# Patient Record
Sex: Female | Born: 1955
Health system: Southern US, Community
[De-identification: ages and names within clinical notes are randomized; demographics above are authoritative.]

## PROBLEM LIST (undated history)

## (undated) DIAGNOSIS — R569 Unspecified convulsions: Secondary | ICD-10-CM

## (undated) DIAGNOSIS — E119 Type 2 diabetes mellitus without complications: Secondary | ICD-10-CM

## (undated) DIAGNOSIS — M1990 Unspecified osteoarthritis, unspecified site: Secondary | ICD-10-CM

## (undated) DIAGNOSIS — J189 Pneumonia, unspecified organism: Secondary | ICD-10-CM

## (undated) DIAGNOSIS — C50912 Malignant neoplasm of unspecified site of left female breast: Principal | ICD-10-CM

## (undated) DIAGNOSIS — I639 Cerebral infarction, unspecified: Secondary | ICD-10-CM

## (undated) DIAGNOSIS — I10 Essential (primary) hypertension: Secondary | ICD-10-CM

## (undated) DIAGNOSIS — C801 Malignant (primary) neoplasm, unspecified: Secondary | ICD-10-CM

## (undated) HISTORY — DX: Unspecified convulsions: R56.9

## (undated) HISTORY — PX: FOOT SURGERY: SHX648

## (undated) HISTORY — PX: TUBAL LIGATION: SHX77

## (undated) HISTORY — DX: Malignant neoplasm of unspecified site of left female breast: C50.912

## (undated) HISTORY — PX: ABDOMINAL HYSTERECTOMY: SHX81

## (undated) HISTORY — DX: Type 2 diabetes mellitus without complications: E11.9

---

## 2006-08-20 ENCOUNTER — Ambulatory Visit: Payer: Self-pay | Admitting: Cardiology

## 2009-06-21 ENCOUNTER — Ambulatory Visit: Payer: Self-pay | Admitting: Physical Medicine & Rehabilitation

## 2009-06-21 ENCOUNTER — Inpatient Hospital Stay (HOSPITAL_COMMUNITY)
Admission: RE | Admit: 2009-06-21 | Discharge: 2009-07-10 | Payer: Self-pay | Admitting: Physical Medicine & Rehabilitation

## 2009-06-29 ENCOUNTER — Ambulatory Visit: Payer: Self-pay | Admitting: Physical Medicine & Rehabilitation

## 2009-09-21 ENCOUNTER — Encounter
Admission: RE | Admit: 2009-09-21 | Discharge: 2009-09-24 | Payer: Self-pay | Admitting: Physical Medicine & Rehabilitation

## 2009-09-24 ENCOUNTER — Ambulatory Visit: Payer: Self-pay | Admitting: Physical Medicine & Rehabilitation

## 2010-01-04 ENCOUNTER — Encounter
Admission: RE | Admit: 2010-01-04 | Discharge: 2010-04-04 | Payer: Self-pay | Source: Home / Self Care | Admitting: Physical Medicine & Rehabilitation

## 2010-01-11 ENCOUNTER — Ambulatory Visit: Payer: Self-pay | Admitting: Physical Medicine & Rehabilitation

## 2010-03-20 ENCOUNTER — Ambulatory Visit: Payer: Self-pay | Admitting: Physical Medicine & Rehabilitation

## 2010-07-15 ENCOUNTER — Encounter: Payer: PRIVATE HEALTH INSURANCE | Attending: Physical Medicine & Rehabilitation

## 2010-07-15 ENCOUNTER — Ambulatory Visit: Payer: PRIVATE HEALTH INSURANCE | Admitting: Physical Medicine & Rehabilitation

## 2010-07-15 DIAGNOSIS — G8110 Spastic hemiplegia affecting unspecified side: Secondary | ICD-10-CM

## 2010-07-15 DIAGNOSIS — I69959 Hemiplegia and hemiparesis following unspecified cerebrovascular disease affecting unspecified side: Secondary | ICD-10-CM | POA: Insufficient documentation

## 2010-07-15 DIAGNOSIS — I6330 Cerebral infarction due to thrombosis of unspecified cerebral artery: Secondary | ICD-10-CM

## 2010-07-15 DIAGNOSIS — I10 Essential (primary) hypertension: Secondary | ICD-10-CM

## 2010-07-25 LAB — COMPREHENSIVE METABOLIC PANEL
Albumin: 3.4 g/dL — ABNORMAL LOW (ref 3.5–5.2)
BUN: 16 mg/dL (ref 6–23)
Calcium: 9.1 mg/dL (ref 8.4–10.5)
Creatinine, Ser: 1.09 mg/dL (ref 0.4–1.2)
Total Protein: 7.3 g/dL (ref 6.0–8.3)

## 2010-07-25 LAB — BASIC METABOLIC PANEL
BUN: 16 mg/dL (ref 6–23)
BUN: 16 mg/dL (ref 6–23)
CO2: 28 mEq/L (ref 19–32)
Calcium: 9.3 mg/dL (ref 8.4–10.5)
Chloride: 103 mEq/L (ref 96–112)
Creatinine, Ser: 1.08 mg/dL (ref 0.4–1.2)
GFR calc non Af Amer: 53 mL/min — ABNORMAL LOW (ref >60)
Glucose, Bld: 96 mg/dL (ref 70–99)
Glucose, Bld: 98 mg/dL (ref 70–99)
Potassium: 3.6 mEq/L (ref 3.5–5.1)
Sodium: 136 mEq/L (ref 135–145)
Sodium: 136 mEq/L (ref 135–145)

## 2010-07-25 LAB — URINALYSIS, ROUTINE W REFLEX MICROSCOPIC
Glucose, UA: NEGATIVE mg/dL
Ketones, ur: NEGATIVE mg/dL
Nitrite: NEGATIVE
Specific Gravity, Urine: 1.024 (ref 1.005–1.030)
pH: 6.5 (ref 5.0–8.0)

## 2010-07-25 LAB — CBC
HCT: 39.3 % (ref 36.0–46.0)
MCHC: 35.7 g/dL (ref 30.0–36.0)
MCV: 93.3 fL (ref 78.0–100.0)
Platelets: 247 10*3/uL (ref 150–400)
RDW: 13 % (ref 11.5–15.5)

## 2010-07-25 LAB — DIFFERENTIAL
Lymphocytes Relative: 46 % (ref 12–46)
Monocytes Absolute: 0.5 10*3/uL (ref 0.1–1.0)
Monocytes Relative: 8 % (ref 3–12)
Neutro Abs: 2.4 10*3/uL (ref 1.7–7.7)
Neutrophils Relative %: 38 % — ABNORMAL LOW (ref 43–77)

## 2010-07-25 LAB — URINE CULTURE: Colony Count: 85000

## 2010-09-02 NOTE — Assessment & Plan Note (Signed)
Mrs. Fingerhut is back regarding her left pontine stroke.  We performed Botox in November to the right pectoralis major and minor as well as biceps brachii.  She had excellent results of this and has had increased range of motion.  Her pain is much better in the arm as well.  She is only having 3-4/10 pain levels.  Pain is dull.  More of an occasional twinge than anything else.  She is doing stretching at home.  She is working on walking still.  She is walking with and without her cane at times.  REVIEW OF SYSTEMS:  Notable for the above.  Does report occasional spasms, depression.  Full 12-point review is in the written health and history section of the chart.  SOCIAL HISTORY:  The patient is living with friend, no other changes are noted.  PHYSICAL EXAMINATION:  VITAL SIGNS:  Blood pressure is 149/81, pulse 51, respiratory rate 18, she is satting 98% on room air. GENERAL:  The patient is pleasant, alert and oriented x3.  Affect is generally bright and appropriate.  Right shoulder is improved.  She is able to actively abduct it to about 60 degrees.  Passively, she can go farther when I pushed her.  Biceps relaxes nicely and she has really near full elbow movement today.  Hands are bit tight and 1/4, but also relaxes and she can achieve neutral there.  Strength overall in the right upper extremity is grossly 2+ to 3+/5.  Right lower extremity is around 4/5.  She tends still to walk with the leg, externally rotated, and the right medial arch tends to collapse a bit in standing.  She has a bit of valgus moment there as well.  Range of motion in the right lower extremity is generally intact.  She may have been a bit tight still on the heel cord.  She continues to have a mild right central VII as well as tongue deviation.  Speech is clear.  Vision is intact. Cognitively, she is appropriate. HEART:  Regular. CHEST:  Clear. ABDOMEN:  Soft, nontender.  ASSESSMENT: 1. Left paramedian pontine  infarct with spastic right hemiparesis. 2. Hypertension.  PLAN: 1. Recommended her seeing an orthotist for medial insert into the shoe     or perhaps another type of shoe to support her foot further.  She     may do better with high-top shoe over the ankle to bend some of the     valgus.  I asked her to work on the quality of her gait, working on     better knee bend and straightening out the leg in swing phase.     Discussed some range of motion exercises also for her right     shoulder that she can do at home. 2. I will see her back in about 4 months' time.  I asked her to call     me if any problems or questions.     Ranelle Oyster, M.D. Electronically Signed    ZTS/MedQ D:  07/15/2010 13:34:32  T:  07/15/2010 22:09:19  Job #:  [id number]

## 2010-11-11 ENCOUNTER — Ambulatory Visit: Payer: PRIVATE HEALTH INSURANCE | Admitting: Physical Medicine & Rehabilitation

## 2010-12-13 ENCOUNTER — Encounter
Payer: PRIVATE HEALTH INSURANCE | Attending: Physical Medicine & Rehabilitation | Admitting: Physical Medicine & Rehabilitation

## 2010-12-13 DIAGNOSIS — I6330 Cerebral infarction due to thrombosis of unspecified cerebral artery: Secondary | ICD-10-CM

## 2010-12-13 DIAGNOSIS — I10 Essential (primary) hypertension: Secondary | ICD-10-CM | POA: Insufficient documentation

## 2010-12-13 DIAGNOSIS — I69959 Hemiplegia and hemiparesis following unspecified cerebrovascular disease affecting unspecified side: Secondary | ICD-10-CM | POA: Insufficient documentation

## 2010-12-13 DIAGNOSIS — G8110 Spastic hemiplegia affecting unspecified side: Secondary | ICD-10-CM

## 2010-12-13 NOTE — Assessment & Plan Note (Signed)
HISTORY:  Julie Parker is back regarding her left pontine stroke.  She has been generally doing fairly well since I last saw her.  She received Botox last fall and really had no significant problems with spasticity since.  She denies pain.  She is walking with her cane and independent at home.  She does feel that she stays favors her right leg still.  She is sleeping well.  She would like to return to full-time driving.  She has attempted driving already with a friend.  REVIEW OF SYSTEMS:  Notable for the above.  Full 12-point review is in the written health and history section of the chart.  SOCIAL HISTORY:  Unchanged.  She is still single.  PHYSICAL EXAMINATION:  VITAL SIGNS:  Blood pressure is 170/105, pulse 58, respiratory rate 16, and she is satting 99% on room air. GENERAL:  The patient is pleasant and alert.  She has active abduction now to really 90 degrees and beyond.  She does have some residual weakness at 3+4-/5 in the shoulder.  She has minimal to zero tone throughout the right upper and lower extremities today.  Strength really throughout the right upper extremity is grossly 4/5, right lower extremity is 4- to 4/5 throughout.  She tends to walk with the shuffle in the right leg and then right knee tends to hyperextend.  In standing, she has elevation of left hemipelvis over the right and actually measured her leg length and the right leg measured about 1.5 cm shorter than the left, going for may ASIS to medial malleolus.  She has mild central VII. HEART:  Regular. CHEST:  Clear. ABDOMEN:  Soft and nontender.  Sensory exam is grossly intact.  ASSESSMENT: 1. Left paramedian pontine infarct with spastic right hemiparesis. 2. Hypertension.  PLAN: 1. The patient could not afford the orthotist intervention is due to     noncoverage.  We did talk about today for leg length discrepancy,     was purchasing a foam quarter inch heel insert which may improve     her gait  quality. 2. I gave her permission to drive during the day. 3. The patient needs to follow up with her family physician regarding     blood pressure. 4. Recommended exercise at home as well as perhaps membership at the     [facility] to pursue pool activities such as water walking, water     aerobics, etc. 5. I will see her back as needed.  She has done quite well overall.     Ranelle Oyster, M.D. Electronically Signed    ZTS/MedQ D:  12/13/2010 13:10:54  T:  12/13/2010 15:13:56  Job #:  [id number]  cc:   Kirstie Peri, MD Fax: [phone]

## 2015-08-07 ENCOUNTER — Ambulatory Visit: Payer: Self-pay | Admitting: Surgery

## 2015-08-07 NOTE — H&P (Signed)
Julie Parker 08/07/2015 2:16 PM Location: [facility] Patient #: [id number] DOB: February 17, 1956 Single / Language: Julie Parker / Race: Black or African American Female  History of Present Illness [name] A. [name] MD; 08/07/2015 3:11 PM) Patient words: left breast cancer    Patient sent at the request of [name] for multifocal left breast cancer. The patient noticed 2 lumps in her left breast. She underwent mammography with core biopsy which found 2 areas of invasive ductal carcinoma. Each measured roughly a centimeter. Core biopsy showed invasive ductal carcinoma ER positive PR positive HER-2/neu negative. Ki-67 was 25 and 26%. She had an area of calcifications which were pleomorphic 4 cm anterior to the lateral lesion measuring 1 cm. Denies any history of breast pain or nipple discharge. She's had a previous stroke but is fairly functional using a wheelchair and walker. She continues to smoke cigarettes.  The patient is a 60 year old female.   Other Problems [name], CMA; 08/07/2015 2:17 PM) Back Pain Breast Cancer Cerebrovascular Accident High blood pressure Hypercholesterolemia Lump In Breast  Past Surgical History [name], [name]; 08/07/2015 2:17 PM) Breast Biopsy Left. Foot Surgery Right. Hysterectomy (not due to cancer) - Partial Resection of Stomach  Diagnostic Studies History [name], CMA; 08/07/2015 2:17 PM) Colonoscopy never Mammogram within last year Pap Smear >5 years ago  Allergies [name], CMA; 08/07/2015 2:17 PM) No Known Drug Allergies 08/07/2015  Medication History ([name], CMA; 08/07/2015 2:18 PM) Benazepril HCl (20MG Tablet, Oral) Active. Clopidogrel Bisulfate (75MG Tablet, Oral) Active. Atenolol-Chlorthalidone (50-25MG Tablet, Oral) Active. Aspirin (81MG Tablet Chewable, Oral) Active. Baclofen (10MG Tablet, Oral) Active. Medications Reconciled  Social History ([place]; 08/07/2015 2:17 PM) Alcohol  use Occasional alcohol use. Caffeine use Carbonated beverages, Coffee, Tea. No drug use Tobacco use Current every day smoker.  Family History [name], CMA; 08/07/2015 2:17 PM) Alcohol Abuse Family Members In General. Arthritis Brother, Mother, Sister. Breast Cancer Family Members In General. Cerebrovascular Accident Sister. Diabetes Mellitus Mother. Hypertension Mother, Sister.  Pregnancy / Birth History [name], [place]; 08/07/2015 2:17 PM) Age at menarche 69 years. Contraceptive History Oral contraceptives. Gravida 3 Irregular periods Maternal age 19-30 Para 1     Review of Systems ([place]; 08/07/2015 2:17 PM) General Present- Weight Gain and Weight Loss. Not Present- Appetite Loss, Chills, Fatigue, Fever and Night Sweats. Skin Not Present- Change in Wart/Mole, Dryness, Hives, Jaundice, New Lesions, Non-Healing Wounds, Rash and Ulcer. Breast Present- Breast Mass. Not Present- Breast Pain, Nipple Discharge and Skin Changes. Cardiovascular Not Present- Chest Pain, Difficulty Breathing Lying Down, Leg Cramps, Palpitations, Rapid Heart Rate, Shortness of Breath and Swelling of Extremities. Gastrointestinal Not Present- Abdominal Pain, Bloating, Bloody Stool, Change in Bowel Habits, Chronic diarrhea, Constipation, Difficulty Swallowing, Excessive gas, Gets full quickly at meals, Hemorrhoids, Indigestion, Nausea, Rectal Pain and Vomiting. Musculoskeletal Present- Joint Stiffness, Muscle Weakness and Swelling of Extremities. Not Present- Back Pain, Joint Pain and Muscle Pain. Neurological Present- Decreased Memory, Trouble walking and Weakness. Not Present- Fainting, Headaches, Numbness, Seizures, Tingling and Tremor. Psychiatric Not Present- Anxiety, Bipolar, Change in Sleep Pattern, Depression, Fearful and Frequent crying. Endocrine Not Present- Cold Intolerance, Excessive Hunger, Hair Changes, Heat Intolerance, Hot flashes and New Diabetes. Hematology Present-  Easy Bruising. Not Present- Excessive bleeding, Gland problems, HIV and Persistent Infections.  Vitals ([name] CMA; 08/07/2015 2:17 PM) 08/07/2015 2:17 PM Weight: 201.2 lb Height: 61in Body Surface Area: 1.89 m Body Mass Index: 38.02 kg/m  Temp.: 97.34F(Temporal)  Pulse: 79 (  Regular)  BP: 126/80 (Sitting, Left Arm, Standard)      Physical Exam (Maloni Musleh A. Jasmyn Picha MD; 08/07/2015 3:12 PM)  General Mental Status-Alert. General Appearance-Consistent with stated age. Hydration-Well hydrated. Voice-Normal.  Head and Neck Head-normocephalic, atraumatic with no lesions or palpable masses. Trachea-midline. Thyroid Gland Characteristics - normal size and consistency.  Breast Note: large breasted pendulous bilaterally  Left breast upper quadrant 1 cm mobile mass. Left breast upper inner quadrant 1 cm mobile mass. No nipple discharge.  Right breast without mass lesion nipple discharge or other abnormality.  Cardiovascular Cardiovascular examination reveals -normal heart sounds, regular rate and rhythm with no murmurs and normal pedal pulses bilaterally.  Neurologic Note: Right-sided hemiplegia  Lymphatic Head & Neck  General Head & Neck Lymphatics: Bilateral - Description - Normal. Axillary  General Axillary Region: Bilateral - Description - Normal. Tenderness - Non Tender.    Assessment & Plan (Jahnyla Parrillo A. Sarin Comunale MD; 08/07/2015 3:14 PM)  BREAST CANCER, LEFT (C50.912) Impression: Patient presents with 2 separate areas of invasive ductal carcinoma left breast ER positive PR positive HER-2/neu negative after mammography and all sham revealed 2 areas one in the left upper and the second the left inner quadrants. She has an area of calcifications which is 1.4 cm as well. She is a smoker who would not be the best reconstruction candidate. She does have a desire to preserve her breast and has very large breasts on exam. She may be a candidate for multiple  lumpectomy with sentinel lymph node mapping on the left which would be less surgery and therefore hopefully less complications for since she has some disability from her stroke.   Patient has desire to conserve breast therefore MRI will be obtained. She is ample tissue for multiple lumpectomies. She may not be a good breast reconstruction candidate due to her continued tobacco abuse which I've Counselor about. Refer to medical oncology, radiation collagen, plastic surgery and obtain MRI. If the MRI shows discrete entities that can be removed with lumpectomy, she is a strong interest in doing that and getting postoperative radiation therapy. If not, we'll proceed with left simple mastectomy and sentinel lymph node mapping. Risk of lumpectomy include bleeding, infection, seroma, more surgery, use of seed/wire, wound care, cosmetic deformity and the need for other treatments, death , blood clots, death. Pt agrees to proceed. Risk of sentinel lymph node mapping include bleeding, infection, lymphedema, shoulder pain. stiffness, dye allergy. cosmetic deformity , blood clots, death, need for more surgery. Pt agres to proceed. Discussed treatment options for breast cancer to include breast conservation vs mastectomy with reconstruction. Pt has decided on mastectomy. Risk include bleeding, infection, flap necrosis, pain, numbness, recurrence, hematoma, other surgery needs. Pt understands and agrees to proceed.  Current Plans Referred to Radiation Oncology, for evaluation and follow up (Radiation Oncology). Routine. Referred to Oncology, for evaluation and follow up (Oncology). Routine. Referred to Surgery - Plastic, for evaluation and follow up (Plastic Surgery). Routine. You are being scheduled for surgery - Our schedulers will call you.  You should hear from our office's scheduling department within 5 working days about the location, date, and time of surgery. We try to make accommodations for patient's  preferences in scheduling surgery, but sometimes the OR schedule or the surgeon's schedule prevents [place] from making those accommodations.  If you have not heard from our office [phone]) in 5 working days, call the office and ask for your surgeon's nurse.  If you have other questions about your diagnosis, plan, or  surgery, call the office and ask for your surgeon's nurse.  Pt Education - [facility] Breast Cancer Information Given - [facility] "Breast Journey" Package Pt Education - Pamphlet Given - Breast Biopsy: discussed with patient and provided information. We discussed the staging and pathophysiology of breast cancer. We discussed all of the different options for treatment for breast cancer including surgery, chemotherapy, radiation therapy, Herceptin, and antiestrogen therapy. We discussed a sentinel lymph node biopsy as she does not appear to having lymph node involvement right now. We discussed the performance of that with injection of radioactive tracer and blue dye. We discussed that she would have an incision underneath her axillary hairline. We discussed that there is a bout a 10-20% chance of having a positive node with a sentinel lymph node biopsy and we will await the permanent pathology to make any other first further decisions in terms of her treatment. One of these options might be to return to the operating room to perform an axillary lymph node dissection. We discussed about a 1-2% risk lifetime of chronic shoulder pain as well as lymphedema associated with a sentinel lymph node biopsy. We discussed the options for treatment of the breast cancer which included lumpectomy versus a mastectomy. We discussed the performance of the lumpectomy with a wire placement. We discussed a 10-20% chance of a positive margin requiring reexcision in the operating room. We also discussed that she may need radiation therapy or antiestrogen therapy or both if she undergoes lumpectomy. We discussed the mastectomy  and the postoperative care for that as well. We discussed that there is no difference in her survival whether she undergoes lumpectomy with radiation therapy or antiestrogen therapy versus a mastectomy. There is a slight difference in the local recurrence rate being 3-5% with lumpectomy and about 1% with a mastectomy. We discussed the risks of operation including bleeding, infection, possible reoperation. She understands her further therapy will be based on what her stages at the time of her operation.  Pt Education - flb breast cancer surgery: discussed with patient and provided information. Pt Education - CCS Mastectomy HCI Pt Education - [facility] Breast Biopsy HCI: discussed with patient and provided information. Pt Education - ABC (After Breast Cancer) Class Info: discussed with patient and provided information. Pt Education - CCS Free Text Education/Instructions: discussed with patient and provided information.

## 2015-08-09 ENCOUNTER — Other Ambulatory Visit: Payer: Self-pay | Admitting: Surgery

## 2015-08-09 DIAGNOSIS — C50912 Malignant neoplasm of unspecified site of left female breast: Secondary | ICD-10-CM

## 2015-08-17 ENCOUNTER — Ambulatory Visit
Admission: RE | Admit: 2015-08-17 | Discharge: 2015-08-17 | Disposition: A | Discharge status: Home / Self Care | Payer: Medicare HMO | Source: Ambulatory Visit | Attending: Surgery | Admitting: Surgery

## 2015-08-17 DIAGNOSIS — C50912 Malignant neoplasm of unspecified site of left female breast: Secondary | ICD-10-CM

## 2015-08-17 MED ORDER — GADOBENATE DIMEGLUMINE 529 MG/ML IV SOLN
20.0000 mL | Freq: Once | INTRAVENOUS | Status: AC | PRN
  Administered 2015-08-17: 20 mL via INTRAVENOUS

## 2015-08-29 ENCOUNTER — Other Ambulatory Visit: Payer: Self-pay | Admitting: Surgery

## 2015-08-29 DIAGNOSIS — N6310 Unspecified lump in the right breast, unspecified quadrant: Secondary | ICD-10-CM

## 2015-08-29 DIAGNOSIS — N6320 Unspecified lump in the left breast, unspecified quadrant: Secondary | ICD-10-CM

## 2015-09-03 ENCOUNTER — Other Ambulatory Visit: Payer: Self-pay | Admitting: Surgery

## 2015-09-03 DIAGNOSIS — N6310 Unspecified lump in the right breast, unspecified quadrant: Secondary | ICD-10-CM

## 2015-09-04 ENCOUNTER — Ambulatory Visit
Admission: RE | Admit: 2015-09-04 | Discharge: 2015-09-04 | Disposition: A | Discharge status: Home / Self Care | Payer: Medicare HMO | Source: Ambulatory Visit | Attending: Surgery | Admitting: Surgery

## 2015-09-04 ENCOUNTER — Other Ambulatory Visit: Payer: Medicare HMO

## 2015-09-04 ENCOUNTER — Ambulatory Visit: Admission: RE | Admit: 2015-09-04 | Payer: Medicare HMO | Source: Ambulatory Visit

## 2015-09-04 DIAGNOSIS — N6310 Unspecified lump in the right breast, unspecified quadrant: Secondary | ICD-10-CM

## 2015-09-13 ENCOUNTER — Other Ambulatory Visit: Payer: Medicare HMO

## 2015-09-21 ENCOUNTER — Ambulatory Visit: Payer: Self-pay | Admitting: Surgery

## 2015-09-21 DIAGNOSIS — C50812 Malignant neoplasm of overlapping sites of left female breast: Secondary | ICD-10-CM

## 2015-09-21 NOTE — H&P (Signed)
Julie Parker 09/21/2015 11:08 AM Location: [facility] Patient #: [id number] DOB: 26-Feb-1956 Single / Language: [name] / Race: Black or African American Female  History of Present Illness [name] A. Salaam Battershell MD; 09/21/2015 11:44 AM) Patient words: Patient returns for follow-up of her multifocal left breast cancer. Multiple areas in her left breast of been discovered for a total of 3 areas of abnormality with 2 of them being biopsied the third highly suspicious. She is seeing medical oncology and radiation oncology. Given her multiple medical problems, left simple mastectomy was recommended since she would not be a good candidate for postoperative radiation therapy and possibly not even chemotherapy. She returns today to discuss this. She has no complaints.  The patient is a 60 year old female.   Allergies ([name], CMA; 09/21/2015 11:08 AM) No Known Drug Allergies 08/07/2015  Medication History ([name], CMA; 09/21/2015 11:08 AM) Benazepril HCl (20MG Tablet, Oral) Active. Clopidogrel Bisulfate (75MG Tablet, Oral) Active. Atenolol-Chlorthalidone (50-25MG Tablet, Oral) Active. Aspirin (81MG Tablet Chewable, Oral) Active. Baclofen (10MG Tablet, Oral) Active. Medications Reconciled    Vitals ([name] CMA; 09/21/2015 11:08 AM) 09/21/2015 11:08 AM Height: 61in Temp.: 98.21F(Temporal)  Pulse: 76 (Regular)  BP: 130/80 (Sitting, Left Arm, Standard)      Physical Exam (Chandra Feger A. Falan Hensler MD; 09/21/2015 11:46 AM)  General Mental Status-Alert. General Appearance-Consistent with stated age. Hydration-Well hydrated. Voice-Normal.  Chest and Lung Exam Chest and lung exam reveals -quiet, even and easy respiratory effort with no use of accessory muscles and on auscultation, normal breath sounds, no adventitious sounds and normal vocal resonance. Inspection Chest Wall - Normal. Back - normal.  Breast Note: no change to her  exam  Cardiovascular Cardiovascular examination reveals -normal heart sounds, regular rate and rhythm with no murmurs and normal pedal pulses bilaterally.  Neurologic Note: wheelchair dependent weak on her left side  Musculoskeletal Note: in wheelchair    Assessment & Plan (Joelly Bolanos A. Jazir Newey MD; 09/21/2015 11:43 AM)  BREAST CANCER, LEFT (C50.912) Impression: Patient presents with 3 separate areas of invasive ductal carcinoma left breast ER positive PR positive HER-2/neu negative after mammography     Patient is seen medical oncology and given the multiple areas of cancer in her left breast and overall functional status, she would not do well with radiation therapy. We discussed this today and she is in agreement to undergo left simple mastectomy with sentinel lymph node mapping for multifocal left breast cancer. Discussed treatment options for breast cancer to include breast conservation vs mastectomy with reconstruction. Pt has decided on mastectomy. Risk include bleeding, infection, flap necrosis, pain, numbness, recurrence, hematoma, other surgery needs. Pt understands and agrees to proceed. Risk of sentinel lymph node mapping include bleeding, infection, lymphedema, shoulder pain. stiffness, dye allergy. cosmetic deformity , blood clots, death, need for more surgery. Pt agres to proceed.  Current Plans You are being scheduled for surgery - Our schedulers will call you.  You should hear from our office's scheduling department within 5 working days about the location, date, and time of surgery. We try to make accommodations for patient's preferences in scheduling surgery, but sometimes the OR schedule or the surgeon's schedule prevents [place] from making those accommodations.  If you have not heard from our office [phone]) in 5 working days, call the office and ask for your surgeon's nurse.  If you have other questions about your diagnosis, plan, or surgery, call the office and  ask for your surgeon's nurse.  We discussed the staging and  pathophysiology of breast cancer. We discussed all of the different options for treatment for breast cancer including surgery, chemotherapy, radiation therapy, Herceptin, and antiestrogen therapy. We discussed a sentinel lymph node biopsy as she does not appear to having lymph node involvement right now. We discussed the performance of that with injection of radioactive tracer and blue dye. We discussed that she would have an incision underneath her axillary hairline. We discussed that there is a bout a 10-20% chance of having a positive node with a sentinel lymph node biopsy and we will await the permanent pathology to make any other first further decisions in terms of her treatment. One of these options might be to return to the operating room to perform an axillary lymph node dissection. We discussed about a 1-2% risk lifetime of chronic shoulder pain as well as lymphedema associated with a sentinel lymph node biopsy. We discussed the options for treatment of the breast cancer which included lumpectomy versus a mastectomy. We discussed the performance of the lumpectomy with a wire placement. We discussed a 10-20% chance of a positive margin requiring reexcision in the operating room. We also discussed that she may need radiation therapy or antiestrogen therapy or both if she undergoes lumpectomy. We discussed the mastectomy and the postoperative care for that as well. We discussed that there is no difference in her survival whether she undergoes lumpectomy with radiation therapy or antiestrogen therapy versus a mastectomy. There is a slight difference in the local recurrence rate being 3-5% with lumpectomy and about 1% with a mastectomy. We discussed the risks of operation including bleeding, infection, possible reoperation. She understands her further therapy will be based on what her stages at the time of her operation.  Pt Education - flb  breast cancer surgery: discussed with patient and provided information. Pt Education - CCS Mastectomy HCI Pt Education - ABC (After Breast Cancer) Class Info: discussed with patient and provided information.

## 2015-10-23 ENCOUNTER — Inpatient Hospital Stay (HOSPITAL_COMMUNITY)
Admission: RE | Admit: 2015-10-23 | Discharge: 2015-10-23 | Disposition: A | Discharge status: Home / Self Care | Payer: Medicare HMO | Source: Ambulatory Visit

## 2015-10-29 ENCOUNTER — Encounter (HOSPITAL_COMMUNITY): Payer: Self-pay | Admitting: *Deleted

## 2015-10-29 ENCOUNTER — Ambulatory Visit (HOSPITAL_COMMUNITY): Payer: Medicare HMO | Admitting: Emergency Medicine

## 2015-10-29 MED ORDER — CEFAZOLIN SODIUM 10 G IJ SOLR
3.0000 g | INTRAMUSCULAR | Status: DC
Start: 2015-10-30 — End: 2015-10-30
  Filled 2015-10-29: qty 3000

## 2015-10-29 MED ORDER — DEXTROSE 5 % IV SOLN
3.0000 g | INTRAVENOUS | Status: DC
  Filled 2015-10-29: qty 3000

## 2015-10-29 NOTE — Progress Notes (Signed)
Pt denies cardiac history, chest pain or sob. She has had 2 strokes which have left her with right sided weakness. Pt's speech is slow but understandable.

## 2015-10-30 ENCOUNTER — Encounter (HOSPITAL_COMMUNITY): Payer: Self-pay | Admitting: Surgery

## 2015-10-30 ENCOUNTER — Encounter (HOSPITAL_COMMUNITY)
Admission: RE | Disposition: A | Discharge status: Home / Self Care | Payer: Self-pay | Source: Ambulatory Visit | Attending: Surgery

## 2015-10-30 ENCOUNTER — Ambulatory Visit (HOSPITAL_COMMUNITY)
Admission: RE | Admit: 2015-10-30 | Discharge: 2015-10-30 | Disposition: A | Discharge status: Home / Self Care | Payer: Medicare HMO | Source: Ambulatory Visit | Attending: Surgery | Admitting: Surgery

## 2015-10-30 ENCOUNTER — Other Ambulatory Visit: Payer: Self-pay

## 2015-10-30 ENCOUNTER — Ambulatory Visit (HOSPITAL_COMMUNITY): Payer: Medicare HMO

## 2015-10-30 DIAGNOSIS — C50812 Malignant neoplasm of overlapping sites of left female breast: Secondary | ICD-10-CM | POA: Insufficient documentation

## 2015-10-30 DIAGNOSIS — Z170 Estrogen receptor positive status [ER+]: Secondary | ICD-10-CM | POA: Diagnosis not present

## 2015-10-30 DIAGNOSIS — Z79899 Other long term (current) drug therapy: Secondary | ICD-10-CM | POA: Diagnosis not present

## 2015-10-30 DIAGNOSIS — Z539 Procedure and treatment not carried out, unspecified reason: Secondary | ICD-10-CM | POA: Insufficient documentation

## 2015-10-30 DIAGNOSIS — Z7982 Long term (current) use of aspirin: Secondary | ICD-10-CM | POA: Insufficient documentation

## 2015-10-30 DIAGNOSIS — Z993 Dependence on wheelchair: Secondary | ICD-10-CM | POA: Insufficient documentation

## 2015-10-30 HISTORY — DX: Unspecified osteoarthritis, unspecified site: M19.90

## 2015-10-30 HISTORY — DX: Malignant (primary) neoplasm, unspecified: C80.1

## 2015-10-30 HISTORY — DX: Pneumonia, unspecified organism: J18.9

## 2015-10-30 HISTORY — DX: Cerebral infarction, unspecified: I63.9

## 2015-10-30 HISTORY — DX: Essential (primary) hypertension: I10

## 2015-10-30 LAB — COMPREHENSIVE METABOLIC PANEL
ALBUMIN: 3.7 g/dL (ref 3.5–5.0)
ALK PHOS: 84 U/L (ref 38–126)
ALT: 27 U/L (ref 14–54)
AST: 26 U/L (ref 15–41)
Anion gap: 9 (ref 5–15)
BUN: 13 mg/dL (ref 6–20)
CALCIUM: 9.3 mg/dL (ref 8.9–10.3)
CHLORIDE: 105 mmol/L (ref 101–111)
CO2: 23 mmol/L (ref 22–32)
CREATININE: 0.9 mg/dL (ref 0.44–1.00)
GFR calc Af Amer: >60 mL/min (ref >60)
GFR calc non Af Amer: >60 mL/min (ref >60)
GLUCOSE: 116 mg/dL — AB (ref 65–99)
Potassium: 3.4 mmol/L — ABNORMAL LOW (ref 3.5–5.1)
SODIUM: 137 mmol/L (ref 135–145)
Total Bilirubin: 0.6 mg/dL (ref 0.3–1.2)
Total Protein: 6.9 g/dL (ref 6.5–8.1)

## 2015-10-30 LAB — CBC WITH DIFFERENTIAL/PLATELET
BASOS ABS: 0 10*3/uL (ref 0.0–0.1)
BASOS PCT: 0 %
EOS ABS: 0.2 10*3/uL (ref 0.0–0.7)
EOS PCT: 3 %
HCT: 40.6 % (ref 36.0–46.0)
HEMOGLOBIN: 14.8 g/dL (ref 12.0–15.0)
LYMPHS ABS: 3.3 10*3/uL (ref 0.7–4.0)
Lymphocytes Relative: 46 %
MCH: 31 pg (ref 26.0–34.0)
MCHC: 36.5 g/dL — ABNORMAL HIGH (ref 30.0–36.0)
MCV: 85.1 fL (ref 78.0–100.0)
Monocytes Absolute: 0.7 10*3/uL (ref 0.1–1.0)
Monocytes Relative: 9 %
NEUTROS PCT: 42 %
Neutro Abs: 3.1 10*3/uL (ref 1.7–7.7)
PLATELETS: 249 10*3/uL (ref 150–400)
RBC: 4.77 MIL/uL (ref 3.87–5.11)
RDW: 14.3 % (ref 11.5–15.5)
WBC: 7.3 10*3/uL (ref 4.0–10.5)

## 2015-10-30 SURGERY — SIMPLE MASTECTOMY WITH AXILLARY SENTINEL NODE BIOPSY
Anesthesia: General | Site: Breast | Laterality: Left

## 2015-10-30 MED ORDER — FENTANYL CITRATE (PF) 250 MCG/5ML IJ SOLN
INTRAMUSCULAR | Status: AC
  Filled 2015-10-30: qty 5

## 2015-10-30 MED ORDER — FENTANYL CITRATE (PF) 100 MCG/2ML IJ SOLN
INTRAMUSCULAR | Status: AC
  Filled 2015-10-30: qty 2

## 2015-10-30 MED ORDER — PROPOFOL 10 MG/ML IV BOLUS
INTRAVENOUS | Status: AC
  Filled 2015-10-30: qty 20

## 2015-10-30 MED ORDER — MIDAZOLAM HCL 2 MG/2ML IJ SOLN
INTRAMUSCULAR | Status: AC
  Filled 2015-10-30: qty 2

## 2015-10-30 MED ORDER — LACTATED RINGERS IV SOLN
INTRAVENOUS | Status: DC
  Administered 2015-10-30: 09:00:00 via INTRAVENOUS

## 2015-10-30 MED ORDER — CHLORHEXIDINE GLUCONATE 4 % EX LIQD: 1.0000 "application " | Freq: Once | CUTANEOUS | Status: DC

## 2015-10-30 NOTE — Progress Notes (Signed)
Patient assisted to wheelchair by nurses at bedside.   Patient escorted to main entrance via wheelchair by Network engineer and family.

## 2015-10-30 NOTE — H&P (Signed)
Pt having significant high blood issues and this is not controllable in the holding area  Multiple attempts to reduce this have not been fully successful and she will need to have better BP control by her primary care.  We will cancel and reschedule at a later time once she sees her primary care  Explained to the patient and she voiced her understanding

## 2015-10-30 NOTE — Interval H&P Note (Signed)
History and Physical Interval Note:  10/30/2015 10:55 AM  Julie Parker  has presented today for surgery, with the diagnosis of LEFT BREAST CANCER  The various methods of treatment have been discussed with the patient and family. After consideration of risks, benefits and other options for treatment, the patient has consented to  Procedure(s): LEFT SIMPLE MASTECTOMY WITH AXILLARY SENTINEL NODE BIOPSY (Left) as a surgical intervention .  The patient's history has been reviewed, patient examined, no change in status, stable for surgery.  I have reviewed the patient's chart and labs.  Questions were answered to the patient's satisfaction.     Malita Ignasiak A.

## 2015-10-30 NOTE — Progress Notes (Signed)
Dr.Moser at bedside, informed him of pts elevated bp.  Per Dr.Moser will continue to monitor.

## 2015-10-30 NOTE — H&P (Signed)
H&P   Julie Parker (MR# [id number])      H&P Info    Chief Strategy Officer Note Status Last Update User Last Update Date/Time   Erroll Luna, MD Signed Erroll Luna, MD     H&P    Expand All Collapse All   Gardiner Fanti  Location: [facility] Patient #: [id number] DOB: 08-13-1955 Single / Language: [name] / Race: Black or African American Female  History of Present Illness  Patient words: Patient returns for follow-up of her multifocal left breast cancer. Multiple areas in her left breast of been discovered for a total of 3 areas of abnormality with 2 of them being biopsied the third highly suspicious. She is seeing medical oncology and radiation oncology. Given her multiple medical problems, left simple mastectomy was recommended since she would not be a good candidate for postoperative radiation therapy and possibly not even chemotherapy. She returns today to discuss this. She has no complaints.  The patient is a 60 year old female.   Allergies  No Known Drug Allergies 08/07/2015  Medication History  Benazepril HCl (20MG Tablet, Oral) Active. Clopidogrel Bisulfate (75MG Tablet, Oral) Active. Atenolol-Chlorthalidone (50-25MG Tablet, Oral) Active. Aspirin (81MG Tablet Chewable, Oral) Active. Baclofen (10MG Tablet, Oral) Active. Medications Reconciled    Vitals  09/21/2015 11:08 AM Height: 61in Temp.: 98.29F(Temporal)  Pulse: 76 (Regular)  BP: 130/80 (Sitting, Left Arm, Standard)      Physical Exam   General Mental Status-Alert. General Appearance-Consistent with stated age. Hydration-Well hydrated. Voice-Normal.  Chest and Lung Exam Chest and lung exam reveals -quiet, even and easy respiratory effort with no use of accessory muscles and on auscultation, normal breath sounds, no adventitious sounds and normal vocal resonance. Inspection Chest Wall - Normal. Back - normal.  Breast Note: no change to her  exam  Cardiovascular Cardiovascular examination reveals -normal heart sounds, regular rate and rhythm with no murmurs and normal pedal pulses bilaterally.  Neurologic Note: wheelchair dependent weak on her left side  Musculoskeletal Note: in wheelchair    Assessment & Plan   BREAST CANCER, LEFT (C50.912) Impression: Patient presents with 3 separate areas of invasive ductal carcinoma left breast ER positive PR positive HER-2/neu negative after mammography     Patient is seen medical oncology and given the multiple areas of cancer in her left breast and overall functional status, she would not do well with radiation therapy. We discussed this today and she is in agreement to undergo left simple mastectomy with sentinel lymph node mapping for multifocal left breast cancer. Discussed treatment options for breast cancer to include breast conservation vs mastectomy with reconstruction. Pt has decided on mastectomy. Risk include bleeding, infection, flap necrosis, pain, numbness, recurrence, hematoma, other surgery needs. Pt understands and agrees to proceed. Risk of sentinel lymph node mapping include bleeding, infection, lymphedema, shoulder pain. stiffness, dye allergy. cosmetic deformity , blood clots, death, need for more surgery. Pt agres to proceed.  Current Plans You are being scheduled for surgery - Our schedulers will call you.  You should hear from our office's scheduling department within 5 working days about the location, date, and time of surgery. We try to make accommodations for patient's preferences in scheduling surgery, but sometimes the OR schedule or the surgeon's schedule prevents [place] from making those accommodations.  If you have not heard from our office [phone]) in 5 working days, call the office and ask for your surgeon's nurse.  If you have other questions about your diagnosis, plan, or surgery, call  the office and ask for your surgeon's nurse.  We  discussed the staging and pathophysiology of breast cancer. We discussed all of the different options for treatment for breast cancer including surgery, chemotherapy, radiation therapy, Herceptin, and antiestrogen therapy. We discussed a sentinel lymph node biopsy as she does not appear to having lymph node involvement right now. We discussed the performance of that with injection of radioactive tracer and blue dye. We discussed that she would have an incision underneath her axillary hairline. We discussed that there is a bout a 10-20% chance of having a positive node with a sentinel lymph node biopsy and we will await the permanent pathology to make any other first further decisions in terms of her treatment. One of these options might be to return to the operating room to perform an axillary lymph node dissection. We discussed about a 1-2% risk lifetime of chronic shoulder pain as well as lymphedema associated with a sentinel lymph node biopsy. We discussed the options for treatment of the breast cancer which included lumpectomy versus a mastectomy. We discussed the performance of the lumpectomy with a wire placement. We discussed a 10-20% chance of a positive margin requiring reexcision in the operating room. We also discussed that she may need radiation therapy or antiestrogen therapy or both if she undergoes lumpectomy. We discussed the mastectomy and the postoperative care for that as well. We discussed that there is no difference in her survival whether she undergoes lumpectomy with radiation therapy or antiestrogen therapy versus a mastectomy. There is a slight difference in the local recurrence rate being 3-5% with lumpectomy and about 1% with a mastectomy. We discussed the risks of operation including bleeding, infection, possible reoperation. She understands her further therapy will be based on what her stages at the time of her operation.  Pt Education - flb breast cancer surgery: discussed with  patient and provided information. Pt Education - CCS Mastectomy HCI Pt Education - ABC (After Breast Cancer) Class Info: discussed with patient and provided information.

## 2015-10-30 NOTE — Progress Notes (Addendum)
Patient's IV removed and clean, dry and intact upon removal.  Nurse tech and nurse assisted dressing patient.  Family not present at bedside.  Attempted to call family but no answer.

## 2015-12-05 ENCOUNTER — Ambulatory Visit: Payer: Self-pay | Admitting: Surgery

## 2015-12-05 DIAGNOSIS — C50912 Malignant neoplasm of unspecified site of left female breast: Secondary | ICD-10-CM

## 2015-12-06 ENCOUNTER — Encounter (HOSPITAL_COMMUNITY): Payer: Self-pay

## 2015-12-06 ENCOUNTER — Encounter (HOSPITAL_COMMUNITY)
Admission: RE | Admit: 2015-12-06 | Discharge: 2015-12-06 | Disposition: A | Discharge status: Home / Self Care | Payer: Medicare HMO | Source: Ambulatory Visit | Attending: Surgery | Admitting: Surgery

## 2015-12-06 DIAGNOSIS — Z01812 Encounter for preprocedural laboratory examination: Secondary | ICD-10-CM | POA: Insufficient documentation

## 2015-12-06 DIAGNOSIS — C50912 Malignant neoplasm of unspecified site of left female breast: Secondary | ICD-10-CM | POA: Insufficient documentation

## 2015-12-06 LAB — CBC
HCT: 43.1 % (ref 36.0–46.0)
Hemoglobin: 15.6 g/dL — ABNORMAL HIGH (ref 12.0–15.0)
MCH: 30.6 pg (ref 26.0–34.0)
MCHC: 36.2 g/dL — AB (ref 30.0–36.0)
MCV: 84.7 fL (ref 78.0–100.0)
Platelets: 257 10*3/uL (ref 150–400)
RBC: 5.09 MIL/uL (ref 3.87–5.11)
RDW: 13.7 % (ref 11.5–15.5)
WBC: 6.5 10*3/uL (ref 4.0–10.5)

## 2015-12-06 LAB — BASIC METABOLIC PANEL
Anion gap: 9 (ref 5–15)
BUN: 11 mg/dL (ref 6–20)
CALCIUM: 9.4 mg/dL (ref 8.9–10.3)
CO2: 22 mmol/L (ref 22–32)
CREATININE: 0.87 mg/dL (ref 0.44–1.00)
Chloride: 104 mmol/L (ref 101–111)
GFR calc non Af Amer: >60 mL/min (ref >60)
Glucose, Bld: 102 mg/dL — ABNORMAL HIGH (ref 65–99)
Potassium: 3.5 mmol/L (ref 3.5–5.1)
Sodium: 135 mmol/L (ref 135–145)

## 2015-12-06 NOTE — Pre-Procedure Instructions (Signed)
    Segundo  12/06/2015      [facility] [address] [place] [address] Phone: [phone] Fax: [phone]    Your procedure is scheduled on Thurs. Aug 10  Report to [facility] Admitting at 5:30 A.M.  Call this number if you have problems the morning of surgery:  [phone]   Remember:  Do not eat food or drink liquids after midnight on Wed. Aug 9  Take these medicines the morning of surgery with A SIP OF WATER: amlodipine(norvasc), atenolol(tenormin), baclofen if needed, ativan (lorazepam) if needed             Stop vitamins/herbal medicines and NSAIDS: ibuprofen, motrin, advil,aleve,BC Powders, Goody's powder today.            Stop plavix and aspirin per Dr. Brantley Stage. Stop on Aug. 4   Do not wear jewelry, make-up or nail polish.  Do not wear lotions, powders, or perfumes.  You may not wear deoderant.  Do not shave 48 hours prior to surgery.  Men may shave face and neck.  Do not bring valuables to the hospital.  [facility] is not responsible for any belongings or valuables.  Contacts, dentures or bridgework may not be worn into surgery.  Leave your suitcase in the car.  After surgery it may be brought to your room.  For patients admitted to the hospital, discharge time will be determined by your treatment team.  Patients discharged the day of surgery will not be allowed to drive home.    Special instructions:  Review preparing for surgery  Please read over the following fact sheets that you were given. Coughing and Deep Breathing

## 2015-12-06 NOTE — Progress Notes (Signed)
PCP: Dr. Francesco Runner @ [facility]. Requesting last office note and clearance if available.  Pt. Stated Dr. Brantley Stage instructed her to stop plavix  And aspirin on Aug. 4

## 2015-12-12 MED ORDER — CELECOXIB 200 MG PO CAPS
400.0000 mg | ORAL_CAPSULE | ORAL | Status: AC
  Administered 2015-12-13: 400 mg via ORAL
  Filled 2015-12-12: qty 2

## 2015-12-12 MED ORDER — ACETAMINOPHEN 500 MG PO TABS
1000.0000 mg | ORAL_TABLET | ORAL | Status: AC
  Administered 2015-12-13: 1000 mg via ORAL
  Filled 2015-12-12: qty 2

## 2015-12-12 MED ORDER — GABAPENTIN 300 MG PO CAPS
300.0000 mg | ORAL_CAPSULE | ORAL | Status: AC
  Administered 2015-12-13: 300 mg via ORAL
  Filled 2015-12-12: qty 1

## 2015-12-12 MED ORDER — CEFAZOLIN SODIUM-DEXTROSE 2-4 GM/100ML-% IV SOLN
2.0000 g | INTRAVENOUS | Status: AC
  Administered 2015-12-13: 2 g via INTRAVENOUS
  Filled 2015-12-12: qty 100

## 2015-12-12 NOTE — Anesthesia Preprocedure Evaluation (Addendum)
Anesthesia Evaluation  Patient identified by MRN, date of birth, ID band Patient awake    Reviewed: Allergy & Precautions, NPO status , Patient's Chart, lab work & pertinent test results, reviewed documented beta blocker date and time   History of Anesthesia Complications Negative for: history of anesthetic complications  Airway Mallampati: III  TM Distance: >3 FB Neck ROM: Full    Dental  (+) Dental Advisory Given, Teeth Intact   Pulmonary neg shortness of breath, COPD: no diagnosis, but suspected., neg recent URI, Current Smoker,  No COPD diagnosis, but I suspect she does have some component of COPD  Patient states this is her baseline breathing and that she always has wheezes. She does not feel short of breath. Will administer albuterol nebulizer preop.   + wheezing      Cardiovascular Exercise Tolerance: Poor hypertension, Pt. on medications and Pt. on home beta blockers (-) angina(-) Past MI, (-) Cardiac Stents and (-) CABG  Rhythm:Regular Rate:Normal     Neuro/Psych CVA (x2, right-side weakness, slow speech), Residual Symptoms    GI/Hepatic negative GI ROS, Neg liver ROS,   Endo/Other  negative endocrine ROS  Renal/GU negative Renal ROS     Musculoskeletal  (+) Arthritis ,   Abdominal (+) + obese,   Peds  Hematology  (+) Blood dyscrasia (on Plavix (last dose 8/4)), ,   Anesthesia Other Findings Left breast cancer  Patient was originally scheduled to have surgery in June but surgery was cancelled due to elevated BP in preop.  Reproductive/Obstetrics                          Anesthesia Physical Anesthesia Plan  ASA: III  Anesthesia Plan: General and Regional   Post-op Pain Management:  Regional for Post-op pain   Induction: Intravenous  Airway Management Planned: LMA  Additional Equipment:   Intra-op Plan:   Post-operative Plan: Extubation in OR  Informed Consent: I have  reviewed the patients History and Physical, chart, labs and discussed the procedure including the risks, benefits and alternatives for the proposed anesthesia with the patient or authorized representative who has indicated his/her understanding and acceptance.   Dental advisory given  Plan Discussed with:   Anesthesia Plan Comments: (Preop albuterol nebulizer)      Anesthesia Quick Evaluation

## 2015-12-13 ENCOUNTER — Ambulatory Visit (HOSPITAL_COMMUNITY): Payer: Medicare HMO | Admitting: Emergency Medicine

## 2015-12-13 ENCOUNTER — Encounter (HOSPITAL_COMMUNITY): Payer: Self-pay | Admitting: *Deleted

## 2015-12-13 ENCOUNTER — Encounter (HOSPITAL_COMMUNITY)
Admission: RE | Disposition: A | Discharge status: Home / Self Care | Payer: Self-pay | Source: Ambulatory Visit | Attending: Surgery

## 2015-12-13 ENCOUNTER — Ambulatory Visit (HOSPITAL_COMMUNITY)
Admission: RE | Admit: 2015-12-13 | Discharge: 2015-12-13 | Disposition: A | Discharge status: Home / Self Care | Payer: Medicare HMO | Source: Ambulatory Visit | Attending: Surgery | Admitting: Surgery

## 2015-12-13 ENCOUNTER — Observation Stay (HOSPITAL_COMMUNITY)
Admission: RE | Admit: 2015-12-13 | Discharge: 2015-12-14 | Disposition: A | Discharge status: Home / Self Care | Payer: Medicare HMO | Source: Ambulatory Visit | Attending: Surgery | Admitting: Surgery

## 2015-12-13 ENCOUNTER — Ambulatory Visit (HOSPITAL_COMMUNITY): Payer: Medicare HMO | Admitting: Certified Registered Nurse Anesthetist

## 2015-12-13 DIAGNOSIS — C50912 Malignant neoplasm of unspecified site of left female breast: Secondary | ICD-10-CM | POA: Diagnosis not present

## 2015-12-13 DIAGNOSIS — I10 Essential (primary) hypertension: Secondary | ICD-10-CM | POA: Diagnosis not present

## 2015-12-13 DIAGNOSIS — C50812 Malignant neoplasm of overlapping sites of left female breast: Secondary | ICD-10-CM

## 2015-12-13 DIAGNOSIS — M1990 Unspecified osteoarthritis, unspecified site: Secondary | ICD-10-CM | POA: Diagnosis not present

## 2015-12-13 DIAGNOSIS — C773 Secondary and unspecified malignant neoplasm of axilla and upper limb lymph nodes: Secondary | ICD-10-CM | POA: Insufficient documentation

## 2015-12-13 DIAGNOSIS — E669 Obesity, unspecified: Secondary | ICD-10-CM | POA: Diagnosis not present

## 2015-12-13 DIAGNOSIS — Z79899 Other long term (current) drug therapy: Secondary | ICD-10-CM | POA: Insufficient documentation

## 2015-12-13 DIAGNOSIS — Z7982 Long term (current) use of aspirin: Secondary | ICD-10-CM | POA: Diagnosis not present

## 2015-12-13 DIAGNOSIS — F17200 Nicotine dependence, unspecified, uncomplicated: Secondary | ICD-10-CM | POA: Diagnosis not present

## 2015-12-13 DIAGNOSIS — D0512 Intraductal carcinoma in situ of left breast: Secondary | ICD-10-CM | POA: Diagnosis not present

## 2015-12-13 DIAGNOSIS — Z170 Estrogen receptor positive status [ER+]: Secondary | ICD-10-CM | POA: Diagnosis not present

## 2015-12-13 DIAGNOSIS — D759 Disease of blood and blood-forming organs, unspecified: Secondary | ICD-10-CM | POA: Diagnosis not present

## 2015-12-13 DIAGNOSIS — Z6834 Body mass index (BMI) 34.0-34.9, adult: Secondary | ICD-10-CM | POA: Diagnosis not present

## 2015-12-13 DIAGNOSIS — Z7902 Long term (current) use of antithrombotics/antiplatelets: Secondary | ICD-10-CM | POA: Insufficient documentation

## 2015-12-13 DIAGNOSIS — Z8673 Personal history of transient ischemic attack (TIA), and cerebral infarction without residual deficits: Secondary | ICD-10-CM | POA: Diagnosis not present

## 2015-12-13 HISTORY — PX: SIMPLE MASTECTOMY WITH AXILLARY SENTINEL NODE BIOPSY: SHX6098

## 2015-12-13 HISTORY — DX: Malignant neoplasm of unspecified site of left female breast: C50.912

## 2015-12-13 LAB — GLUCOSE, CAPILLARY
GLUCOSE-CAPILLARY: 167 mg/dL — AB (ref 65–99)
GLUCOSE-CAPILLARY: 204 mg/dL — AB (ref 65–99)
Glucose-Capillary: 165 mg/dL — ABNORMAL HIGH (ref 65–99)

## 2015-12-13 LAB — CREATININE, SERUM: CREATININE: 0.97 mg/dL (ref 0.44–1.00)

## 2015-12-13 LAB — CBC
HEMATOCRIT: 42.7 % (ref 36.0–46.0)
HEMOGLOBIN: 15.5 g/dL — AB (ref 12.0–15.0)
MCH: 31.1 pg (ref 26.0–34.0)
MCHC: 36.3 g/dL — ABNORMAL HIGH (ref 30.0–36.0)
MCV: 85.6 fL (ref 78.0–100.0)
Platelets: 239 10*3/uL (ref 150–400)
RBC: 4.99 MIL/uL (ref 3.87–5.11)
RDW: 13.7 % (ref 11.5–15.5)
WBC: 7.7 10*3/uL (ref 4.0–10.5)

## 2015-12-13 SURGERY — SIMPLE MASTECTOMY WITH AXILLARY SENTINEL NODE BIOPSY
Anesthesia: Regional | Site: Breast | Laterality: Left

## 2015-12-13 MED ORDER — KCL IN DEXTROSE-NACL 20-5-0.9 MEQ/L-%-% IV SOLN
INTRAVENOUS | Status: DC
  Administered 2015-12-13: 13:00:00 via INTRAVENOUS
  Filled 2015-12-13 (×2): qty 1000

## 2015-12-13 MED ORDER — LIDOCAINE 2% (20 MG/ML) 5 ML SYRINGE
INTRAMUSCULAR | Status: AC
Start: 2015-12-13 — End: 2015-12-13
  Filled 2015-12-13: qty 5

## 2015-12-13 MED ORDER — PROMETHAZINE HCL 25 MG/ML IJ SOLN: 6.2500 mg | INTRAMUSCULAR | Status: DC | PRN

## 2015-12-13 MED ORDER — INSULIN ASPART 100 UNIT/ML ~~LOC~~ SOLN
0.0000 [IU] | Freq: Three times a day (TID) | SUBCUTANEOUS | Status: DC
  Administered 2015-12-13: 5 [IU] via SUBCUTANEOUS
  Administered 2015-12-13: 3 [IU] via SUBCUTANEOUS
  Administered 2015-12-14 (×2): 2 [IU] via SUBCUTANEOUS

## 2015-12-13 MED ORDER — PHENYLEPHRINE HCL 10 MG/ML IJ SOLN
INTRAVENOUS | Status: DC | PRN
  Administered 2015-12-13: 30 ug/min via INTRAVENOUS

## 2015-12-13 MED ORDER — SUCCINYLCHOLINE CHLORIDE 200 MG/10ML IV SOSY
PREFILLED_SYRINGE | INTRAVENOUS | Status: AC
  Filled 2015-12-13: qty 10

## 2015-12-13 MED ORDER — SIMETHICONE 80 MG PO CHEW: 40.0000 mg | CHEWABLE_TABLET | Freq: Four times a day (QID) | ORAL | Status: DC | PRN

## 2015-12-13 MED ORDER — ZOLPIDEM TARTRATE 5 MG PO TABS: 5.0000 mg | ORAL_TABLET | Freq: Every evening | ORAL | Status: DC | PRN

## 2015-12-13 MED ORDER — PROPOFOL 10 MG/ML IV BOLUS
INTRAVENOUS | Status: AC
  Filled 2015-12-13: qty 40

## 2015-12-13 MED ORDER — BENAZEPRIL HCL 20 MG PO TABS
20.0000 mg | ORAL_TABLET | Freq: Every day | ORAL | Status: DC
  Administered 2015-12-14: 20 mg via ORAL
  Filled 2015-12-13 (×2): qty 1

## 2015-12-13 MED ORDER — MIDAZOLAM HCL 2 MG/2ML IJ SOLN
INTRAMUSCULAR | Status: AC
  Filled 2015-12-13: qty 2

## 2015-12-13 MED ORDER — ACETAMINOPHEN 325 MG PO TABS
650.0000 mg | ORAL_TABLET | Freq: Four times a day (QID) | ORAL | Status: DC | PRN
Start: 2015-12-13 — End: 2015-12-14

## 2015-12-13 MED ORDER — AMLODIPINE BESYLATE 10 MG PO TABS
10.0000 mg | ORAL_TABLET | Freq: Every day | ORAL | Status: DC
  Administered 2015-12-14: 10 mg via ORAL
  Filled 2015-12-13 (×2): qty 1

## 2015-12-13 MED ORDER — CHLORTHALIDONE 25 MG PO TABS
25.0000 mg | ORAL_TABLET | Freq: Every day | ORAL | Status: DC
  Administered 2015-12-13 – 2015-12-14 (×2): 25 mg via ORAL
  Filled 2015-12-13 (×2): qty 1

## 2015-12-13 MED ORDER — LACTATED RINGERS IV SOLN
INTRAVENOUS | Status: DC | PRN
  Administered 2015-12-13 (×2): via INTRAVENOUS

## 2015-12-13 MED ORDER — DEXAMETHASONE SODIUM PHOSPHATE 10 MG/ML IJ SOLN
INTRAMUSCULAR | Status: DC | PRN
  Administered 2015-12-13: 8 mg via INTRAVENOUS

## 2015-12-13 MED ORDER — PHENYLEPHRINE HCL 10 MG/ML IJ SOLN
INTRAMUSCULAR | Status: DC | PRN
  Administered 2015-12-13 (×8): 40 ug via INTRAVENOUS
  Administered 2015-12-13: 80 ug via INTRAVENOUS

## 2015-12-13 MED ORDER — ACETAMINOPHEN 650 MG RE SUPP
650.0000 mg | Freq: Four times a day (QID) | RECTAL | Status: DC | PRN
Start: 2015-12-13 — End: 2015-12-14

## 2015-12-13 MED ORDER — PROPOFOL 10 MG/ML IV BOLUS
INTRAVENOUS | Status: AC
  Filled 2015-12-13: qty 20

## 2015-12-13 MED ORDER — PROPOFOL 10 MG/ML IV BOLUS
INTRAVENOUS | Status: DC | PRN
  Administered 2015-12-13: 50 mg via INTRAVENOUS
  Administered 2015-12-13: 150 mg via INTRAVENOUS

## 2015-12-13 MED ORDER — LIDOCAINE HCL (CARDIAC) 20 MG/ML IV SOLN
INTRAVENOUS | Status: DC | PRN
  Administered 2015-12-13: 100 mg via INTRAVENOUS

## 2015-12-13 MED ORDER — BUPIVACAINE HCL (PF) 0.25 % IJ SOLN
INTRAMUSCULAR | Status: DC | PRN
  Administered 2015-12-13: 30 mL via EPIDURAL

## 2015-12-13 MED ORDER — CLOPIDOGREL BISULFATE 75 MG PO TABS
75.0000 mg | ORAL_TABLET | Freq: Every day | ORAL | Status: DC
  Administered 2015-12-14: 75 mg via ORAL
  Filled 2015-12-13 (×2): qty 1

## 2015-12-13 MED ORDER — FENTANYL CITRATE (PF) 100 MCG/2ML IJ SOLN: 25.0000 ug | INTRAMUSCULAR | Status: DC | PRN

## 2015-12-13 MED ORDER — KETOROLAC TROMETHAMINE 15 MG/ML IJ SOLN: 15.0000 mg | Freq: Four times a day (QID) | INTRAMUSCULAR | Status: DC | PRN

## 2015-12-13 MED ORDER — CEFAZOLIN SODIUM-DEXTROSE 2-4 GM/100ML-% IV SOLN
2.0000 g | Freq: Three times a day (TID) | INTRAVENOUS | Status: AC
  Administered 2015-12-13: 2 g via INTRAVENOUS
  Filled 2015-12-13: qty 100

## 2015-12-13 MED ORDER — METHYLENE BLUE 0.5 % INJ SOLN
INTRAVENOUS | Status: AC
  Filled 2015-12-13: qty 10

## 2015-12-13 MED ORDER — BACLOFEN 10 MG PO TABS
10.0000 mg | ORAL_TABLET | Freq: Three times a day (TID) | ORAL | Status: DC | PRN
  Administered 2015-12-13: 10 mg via ORAL
  Filled 2015-12-13: qty 1

## 2015-12-13 MED ORDER — CHLORHEXIDINE GLUCONATE CLOTH 2 % EX PADS: 6.0000 | MEDICATED_PAD | Freq: Once | CUTANEOUS | Status: DC

## 2015-12-13 MED ORDER — POLYETHYLENE GLYCOL 3350 17 G PO PACK: 17.0000 g | PACK | Freq: Every day | ORAL | Status: DC | PRN

## 2015-12-13 MED ORDER — ONDANSETRON HCL 4 MG/2ML IJ SOLN: 4.0000 mg | Freq: Four times a day (QID) | INTRAMUSCULAR | Status: DC | PRN

## 2015-12-13 MED ORDER — ONDANSETRON 4 MG PO TBDP: 4.0000 mg | ORAL_TABLET | Freq: Four times a day (QID) | ORAL | Status: DC | PRN

## 2015-12-13 MED ORDER — ALBUTEROL SULFATE (2.5 MG/3ML) 0.083% IN NEBU: 2.5000 mg | INHALATION_SOLUTION | Freq: Once | RESPIRATORY_TRACT | Status: DC | PRN

## 2015-12-13 MED ORDER — FENTANYL CITRATE (PF) 250 MCG/5ML IJ SOLN
INTRAMUSCULAR | Status: AC
  Filled 2015-12-13: qty 5

## 2015-12-13 MED ORDER — FENTANYL CITRATE (PF) 250 MCG/5ML IJ SOLN
INTRAMUSCULAR | Status: DC | PRN
  Administered 2015-12-13: 50 ug via INTRAVENOUS
  Administered 2015-12-13 (×3): 25 ug via INTRAVENOUS
  Administered 2015-12-13 (×2): 50 ug via INTRAVENOUS
  Administered 2015-12-13: 25 ug via INTRAVENOUS

## 2015-12-13 MED ORDER — 0.9 % SODIUM CHLORIDE (POUR BTL) OPTIME
TOPICAL | Status: DC | PRN
  Administered 2015-12-13: 1000 mL

## 2015-12-13 MED ORDER — ONDANSETRON HCL 4 MG/2ML IJ SOLN
INTRAMUSCULAR | Status: DC | PRN
  Administered 2015-12-13: 4 mg via INTRAVENOUS

## 2015-12-13 MED ORDER — ALBUTEROL SULFATE (2.5 MG/3ML) 0.083% IN NEBU
INHALATION_SOLUTION | RESPIRATORY_TRACT | Status: AC
  Filled 2015-12-13: qty 3

## 2015-12-13 MED ORDER — HYDROMORPHONE HCL 1 MG/ML IJ SOLN: 1.0000 mg | INTRAMUSCULAR | Status: DC | PRN

## 2015-12-13 MED ORDER — TECHNETIUM TC 99M SULFUR COLLOID FILTERED
1.0000 | Freq: Once | INTRAVENOUS | Status: AC | PRN
  Administered 2015-12-13: 1 via INTRADERMAL

## 2015-12-13 MED ORDER — EPHEDRINE SULFATE 50 MG/ML IJ SOLN
INTRAMUSCULAR | Status: DC | PRN
  Administered 2015-12-13: 5 mg via INTRAVENOUS

## 2015-12-13 MED ORDER — ENOXAPARIN SODIUM 40 MG/0.4ML ~~LOC~~ SOLN
40.0000 mg | SUBCUTANEOUS | Status: DC
  Administered 2015-12-14: 40 mg via SUBCUTANEOUS
  Filled 2015-12-13: qty 0.4

## 2015-12-13 MED ORDER — ALBUTEROL SULFATE (2.5 MG/3ML) 0.083% IN NEBU
2.5000 mg | INHALATION_SOLUTION | Freq: Once | RESPIRATORY_TRACT | Status: AC
  Administered 2015-12-13: 2.5 mg via RESPIRATORY_TRACT

## 2015-12-13 MED ORDER — ATENOLOL 50 MG PO TABS
50.0000 mg | ORAL_TABLET | Freq: Every day | ORAL | Status: DC
  Administered 2015-12-14: 50 mg via ORAL
  Filled 2015-12-13 (×2): qty 1

## 2015-12-13 MED ORDER — SODIUM CHLORIDE 0.9 % IJ SOLN
INTRAMUSCULAR | Status: AC
  Filled 2015-12-13: qty 10

## 2015-12-13 MED ORDER — LORAZEPAM 0.5 MG PO TABS: 0.5000 mg | ORAL_TABLET | Freq: Every day | ORAL | Status: DC | PRN

## 2015-12-13 MED ORDER — OXYCODONE HCL 5 MG PO TABS
5.0000 mg | ORAL_TABLET | ORAL | Status: DC | PRN
  Administered 2015-12-13: 10 mg via ORAL
  Filled 2015-12-13: qty 2

## 2015-12-13 MED ORDER — MIDAZOLAM HCL 2 MG/2ML IJ SOLN
INTRAMUSCULAR | Status: DC | PRN
  Administered 2015-12-13 (×2): .5 mg via INTRAVENOUS

## 2015-12-13 MED ORDER — HYDRALAZINE HCL 20 MG/ML IJ SOLN: 10.0000 mg | INTRAMUSCULAR | Status: DC | PRN

## 2015-12-13 MED ORDER — ROCURONIUM BROMIDE 10 MG/ML (PF) SYRINGE
PREFILLED_SYRINGE | INTRAVENOUS | Status: AC
  Filled 2015-12-13: qty 10

## 2015-12-13 SURGICAL SUPPLY — 43 items
APPLIER CLIP 9.375 MED OPEN (MISCELLANEOUS) ×3
APR CLP MED 9.3 20 MLT OPN (MISCELLANEOUS) ×1
BINDER BREAST XXLRG (GAUZE/BANDAGES/DRESSINGS) ×2 IMPLANT
CANISTER SUCTION 2500CC (MISCELLANEOUS) ×3 IMPLANT
CHLORAPREP W/TINT 26ML (MISCELLANEOUS) ×3 IMPLANT
CLIP APPLIE 9.375 MED OPEN (MISCELLANEOUS) ×1 IMPLANT
CONT SPEC 4OZ CLIKSEAL STRL BL (MISCELLANEOUS) ×3 IMPLANT
COVER PROBE W GEL 5X96 (DRAPES) ×3 IMPLANT
COVER SURGICAL LIGHT HANDLE (MISCELLANEOUS) ×3 IMPLANT
DRAIN CHANNEL 19F RND (DRAIN) ×5 IMPLANT
DRAPE CHEST BREAST 15X10 FENES (DRAPES) ×3 IMPLANT
DRSG PAD ABDOMINAL 8X10 ST (GAUZE/BANDAGES/DRESSINGS) ×4 IMPLANT
ELECT CAUTERY BLADE 6.4 (BLADE) ×3 IMPLANT
ELECT REM PT RETURN 9FT ADLT (ELECTROSURGICAL) ×3
ELECTRODE REM PT RTRN 9FT ADLT (ELECTROSURGICAL) ×1 IMPLANT
EVACUATOR SILICONE 100CC (DRAIN) ×5 IMPLANT
GAUZE SPONGE 4X4 12PLY STRL (GAUZE/BANDAGES/DRESSINGS) ×2 IMPLANT
GLOVE BIO SURGEON STRL SZ 6 (GLOVE) ×6 IMPLANT
GLOVE BIO SURGEON STRL SZ7.5 (GLOVE) ×2 IMPLANT
GLOVE BIO SURGEON STRL SZ8 (GLOVE) ×3 IMPLANT
GLOVE BIOGEL PI IND STRL 6 (GLOVE) IMPLANT
GLOVE BIOGEL PI IND STRL 7.5 (GLOVE) IMPLANT
GLOVE BIOGEL PI IND STRL 8 (GLOVE) ×1 IMPLANT
GLOVE BIOGEL PI INDICATOR 6 (GLOVE) ×4
GLOVE BIOGEL PI INDICATOR 7.5 (GLOVE) ×2
GLOVE BIOGEL PI INDICATOR 8 (GLOVE) ×2
GOWN STRL REUS W/ TWL LRG LVL3 (GOWN DISPOSABLE) ×3 IMPLANT
GOWN STRL REUS W/ TWL XL LVL3 (GOWN DISPOSABLE) ×1 IMPLANT
GOWN STRL REUS W/TWL LRG LVL3 (GOWN DISPOSABLE) ×9
GOWN STRL REUS W/TWL XL LVL3 (GOWN DISPOSABLE) ×3
KIT BASIN OR (CUSTOM PROCEDURE TRAY) ×3 IMPLANT
KIT ROOM TURNOVER OR (KITS) ×3 IMPLANT
LIQUID BAND (GAUZE/BANDAGES/DRESSINGS) ×3 IMPLANT
NS IRRIG 1000ML POUR BTL (IV SOLUTION) ×3 IMPLANT
PACK GENERAL/GYN (CUSTOM PROCEDURE TRAY) ×3 IMPLANT
PAD ARMBOARD 7.5X6 YLW CONV (MISCELLANEOUS) ×3 IMPLANT
SPECIMEN JAR X LARGE (MISCELLANEOUS) ×3 IMPLANT
SUT ETHILON 3 0 FSL (SUTURE) ×5 IMPLANT
SUT MNCRL AB 4-0 PS2 18 (SUTURE) ×5 IMPLANT
SUT VIC AB 3-0 SH 18 (SUTURE) ×5 IMPLANT
SYR CONTROL 10ML LL (SYRINGE) IMPLANT
TAPE CLOTH SURG 6X10 WHT LF (GAUZE/BANDAGES/DRESSINGS) ×2 IMPLANT
TOWEL OR 17X26 10 PK STRL BLUE (TOWEL DISPOSABLE) ×3 IMPLANT

## 2015-12-13 NOTE — Care Management Obs Status (Signed)
MEDICARE OBSERVATION STATUS NOTIFICATION   Patient Details  Name: PAYZLEY KEYS MRN: [id number] Date of Birth: June 03, 1955   Medicare Observation Status Notification Given:       Pollie Friar, RN 12/13/2015, 11:55 AM

## 2015-12-13 NOTE — Anesthesia Procedure Notes (Signed)
Procedure Name: LMA Insertion Date/Time: 12/13/2015 8:47 AM Performed by: Merdis Delay Pre-anesthesia Checklist: Patient identified, Emergency Drugs available, Suction available, Timeout performed and Patient being monitored Patient Re-evaluated:Patient Re-evaluated prior to inductionOxygen Delivery Method: Circle system utilized Preoxygenation: Pre-oxygenation with 100% oxygen Intubation Type: IV induction LMA: LMA inserted LMA Size: 4.0 Number of attempts: 1 Placement Confirmation: positive ETCO2,  CO2 detector and breath sounds checked- equal and bilateral Tube secured with: Tape Dental Injury: Teeth and Oropharynx as per pre-operative assessment

## 2015-12-13 NOTE — H&P (Signed)
Pre-op/Pre-procedure Orders Open    09/21/2015 King, MD  General Surgery   Malignant neoplasm of overlapping sites of left female breast [facility])  Dx   H&P     Julie Parker  Location: [facility] Patient #: [id number] DOB: 09/22/1955 Single / Language: [name] / Race: Black or African American Female  History of Present Illness Julie Moores A. Bernie Fobes MD; /2017 11:44 AM) Patient words: Patient returns for follow-up of her multifocal left breast cancer. Multiple areas in her left breast of been discovered for a total of 3 areas of abnormality with 2 of them being biopsied the third highly suspicious. She is seeing medical oncology and radiation oncology. Given her multiple medical problems, left simple mastectomy was recommended since she would not be a good candidate for postoperative radiation therapy and possibly not even chemotherapy. She returns today to discuss this. She has no complaints.  The patient is a 60 year old female.   Allergies (Julie Parker, CMA;  No Known Drug Allergies 08/07/2015  Medication History (Julie Parker, CMA;  11:08 AM) Benazepril HCl (20MG Tablet, Oral) Active. Clopidogrel Bisulfate (75MG Tablet, Oral) Active. Atenolol-Chlorthalidone (50-25MG Tablet, Oral) Active. Aspirin (81MG Tablet Chewable, Oral) Active. Baclofen (10MG Tablet, Oral) Active. Medications Reconciled    Vitals (Julie Parker CMA;  09/21/2015 11:08 AM Height: 61in Temp.: 98.20F(Temporal)  Pulse: 76 (Regular)  BP: 130/80 (Sitting, Left Arm, Standard)      Physical Exam (Julie Kreis A. Wilena Tyndall MD;  General Mental Status-Alert. General Appearance-Consistent with stated age. Hydration-Well hydrated. Voice-Normal.  Chest and Lung Exam Chest and lung exam reveals -quiet, even and easy respiratory effort with no use of accessory muscles and on auscultation, normal breath sounds, no adventitious sounds and  normal vocal resonance. Inspection Chest Wall - Normal. Back - normal.  Breast Note: no change to her exam  Cardiovascular Cardiovascular examination reveals -normal heart sounds, regular rate and rhythm with no murmurs and normal pedal pulses bilaterally.  Neurologic Note: wheelchair dependent weak on her left side  Musculoskeletal Note: in wheelchair    Assessment & Plan (Julie Ohanian A. Avier Jech MD; )  BREAST CANCER, LEFT (C50.912) Impression: Patient presents with 3 separate areas of invasive ductal carcinoma left breast ER positive PR positive HER-2/neu negative after mammography     Patient is seen medical oncology and given the multiple areas of cancer in her left breast and overall functional status, she would not do well with radiation therapy. We discussed this today and she is in agreement to undergo left simple mastectomy with sentinel lymph node mapping for multifocal left breast cancer. Discussed treatment options for breast cancer to include breast conservation vs mastectomy with reconstruction. Pt has decided on mastectomy. Risk include bleeding, infection, flap necrosis, pain, numbness, recurrence, hematoma, other surgery needs. Pt understands and agrees to proceed. Risk of sentinel lymph node mapping include bleeding, infection, lymphedema, shoulder pain. stiffness, dye allergy. cosmetic deformity , blood clots, death, need for more surgery. Pt agres to proceed.  Current Plans You are being scheduled for surgery - Our schedulers will call you.  You should hear from our office's scheduling department within 5 working days about the location, date, and time of surgery. We try to make accommodations for patient's preferences in scheduling surgery, but sometimes the OR schedule or the surgeon's schedule prevents [place] from making those accommodations.  If you have not heard from our office [phone]) in 5 working days, call the office and ask for your  surgeon's nurse.  If you have other questions about your diagnosis, plan, or surgery, call the office and ask for your surgeon's nurse.  We discussed the staging and pathophysiology of breast cancer. We discussed all of the different options for treatment for breast cancer including surgery, chemotherapy, radiation therapy, Herceptin, and antiestrogen therapy. We discussed a sentinel lymph node biopsy as she does not appear to having lymph node involvement right now. We discussed the performance of that with injection of radioactive tracer and blue dye. We discussed that she would have an incision underneath her axillary hairline. We discussed that there is a bout a 10-20% chance of having a positive node with a sentinel lymph node biopsy and we will await the permanent pathology to make any other first further decisions in terms of her treatment. One of these options might be to return to the operating room to perform an axillary lymph node dissection. We discussed about a 1-2% risk lifetime of chronic shoulder pain as well as lymphedema associated with a sentinel lymph node biopsy. We discussed the options for treatment of the breast cancer which included lumpectomy versus a mastectomy. We discussed the performance of the lumpectomy with a wire placement. We discussed a 10-20% chance of a positive margin requiring reexcision in the operating room. We also discussed that she may need radiation therapy or antiestrogen therapy or both if she undergoes lumpectomy. We discussed the mastectomy and the postoperative care for that as well. We discussed that there is no difference in her survival whether she undergoes lumpectomy with radiation therapy or antiestrogen therapy versus a mastectomy. There is a slight difference in the local recurrence rate being 3-5% with lumpectomy and about 1% with a mastectomy. We discussed the risks of operation including bleeding, infection, possible reoperation. She understands  her further therapy will be based on what her stages at the time of her operation.  Pt Education - flb breast cancer surgery: discussed with patient and provided information. Pt Education - CCS Mastectomy HCI Pt Education - ABC (After Breast Cancer) Class Info: discussed with patient and provided information.    Additional Documentation   Encounter Info:   Billing Info,   History,   Allergies,   Detailed Report     Orders Placed    NM Sentinel Node Inj-No Rpt (Breast)  Medication Changes     None    Medication List  Visit Diagnoses      Malignant neoplasm of overlapping sites of left female breast [facility])    Problem List

## 2015-12-13 NOTE — Anesthesia Postprocedure Evaluation (Signed)
Anesthesia Post Note  Patient: Julie Parker  Procedure(s) Performed: Procedure(s) (LRB): LEFT SIMPLE MASTECTOMY WITH AXILLARY SENTINEL NODE BIOPSY (Left)  Patient location during evaluation: PACU Anesthesia Type: General Level of consciousness: awake and alert Pain management: pain level controlled Vital Signs Assessment: post-procedure vital signs reviewed and stable Respiratory status: spontaneous breathing, nonlabored ventilation, respiratory function stable and patient connected to nasal cannula oxygen Cardiovascular status: blood pressure returned to baseline and stable Postop Assessment: no signs of nausea or vomiting Anesthetic complications: no    Last Vitals:  Vitals:   12/13/15 0956 12/13/15 1020  BP:  (!) 152/80  Pulse: (!) 57 (!) 48  Resp: 14 12  Temp:  36.3 C    Last Pain:  Vitals:   12/13/15 1025  TempSrc:   PainSc: Asleep                 Nilda Simmer

## 2015-12-13 NOTE — Op Note (Signed)
Julie Parker Virnig 1956-02-19 [id number] 12/13/2015   Preoperative diagnosis: Multifocal left breast cancer  Postoperative diagnosis: Same  Procedure: Left simple mastectomy with left sentinel lymph node mapping  Surgeon: Kendi Defalco A.  Pensions consultant:   Anesthesia:General  Clinical History and Indications:The patient is seen in the holding area and we reviewed the plans for the procedure as noted above. We reviewed the risks and complications a final time. She had no further questions. I marked theleft side as the operative side.  Description of Procedure: The patient was taken to the operating room. After satisfactory general anesthesia was obtained the timeout was done.    A full prep and drape was then done.  I outlined an elliptical incision and marked the inframammary fold and midline. The incision was made. The usual skin flaps were raised. I went to the clavicle superiorly and sternum medially and towards the latissimus laterally.    After the superior flap was complete I was able to use the neoprobe to locate a sentinel node.  There was 1 hot left axillary sentinel node. This is removed. Background counts approached 0. There are no other abnormal nodes on exam.  Once the node was removed it was forwarded to pathology.   The inferior flap was then made going to the inframammary fold and out to the latissimus. The breast was then removed from the pectoralis starting medially and working laterally. When I got to the lateral aspect of the pectoralis major muscle I opened the clavipectoral fascia. I finished removing the breast from the serratus muscles.    I therefore completed the mastectomy by dividing the remaining attachments from the breast to the chest wall and latissimus but not taking any more tissue from the axilla. The specimen was marked to orient it for the pathologist and passed off the table.  I spent several minutes irrigating and making sure everything was  dry. I then placed two  19 [place] Blake drain through a small incision in the inferior flap and laid along the pectoralis muscle. It was secured with  2-0 nylon suture.  Another irrigation and check for hemostasis was made. Everything appeared to be dry. Incision was then closed with 3-0 Vicryl and 4-0 Monocryl. Liquid adhesive applied.  The patient tolerated the procedure well. There were no operative complications. All counts were correct. Estimated blood loss was 100 cc. Sterile dressings were applied and the patient taken to the PACU in satisfactory condition.  Turner Daniels, MD, FACS 12/13/2015 9:06 AM

## 2015-12-13 NOTE — Interval H&P Note (Signed)
History and Physical Interval Note:  12/13/2015 7:10 AM  Julie Parker  has presented today for surgery, with the diagnosis of LEFT BREAST CANCER  The various methods of treatment have been discussed with the patient and family. After consideration of risks, benefits and other options for treatment, the patient has consented to  Procedure(s): LEFT SIMPLE MASTECTOMY WITH AXILLARY SENTINEL NODE BIOPSY (Left) as a surgical intervention .  The patient's history has been reviewed, patient examined, no change in status, stable for surgery.  I have reviewed the patient's chart and labs.  Questions were answered to the patient's satisfaction.     Casy Tavano A.

## 2015-12-13 NOTE — Anesthesia Procedure Notes (Signed)
Anesthesia Regional Block:  Pectoralis block  Pre-Anesthetic Checklist: ,, timeout performed, Correct Patient, Correct Site, Correct Laterality, Correct Procedure, Correct Position, site marked, Risks and benefits discussed,  Surgical consent,  Pre-op evaluation,  At surgeon's request and post-op pain management  Laterality: Left  Prep: chloraprep       Needles:  Injection technique: Single-shot  Needle Type: Stimiplex     Needle Length: 9cm 9 cm Needle Gauge: 21 and 21 G    Additional Needles:  Procedures: ultrasound guided (picture in chart) Pectoralis block Narrative:  Injection made incrementally with aspirations every 5 mL.  Performed by: Personally  Anesthesiologist: Nilda Simmer

## 2015-12-13 NOTE — Transfer of Care (Signed)
Immediate Anesthesia Transfer of Care Note  Patient: Julie Parker  Procedure(s) Performed: Procedure(s): LEFT SIMPLE MASTECTOMY WITH AXILLARY SENTINEL NODE BIOPSY (Left)  Patient Location: PACU  Anesthesia Type:General  Level of Consciousness: patient cooperative, drowsy  Airway & Oxygen Therapy: Patient Spontanous Breathing and Patient connected to nasal cannula oxygen  Post-op Assessment: Report given to RN and Post -op Vital signs reviewed and stable  Post vital signs: Reviewed and stable  Last Vitals:  Vitals:   12/13/15 0643 12/13/15 0651  BP: (!) 185/91 (!) 168/94  Pulse: (!) 49   Resp: 16   Temp: 36.6 C     Last Pain:  Vitals:   12/13/15 0643  TempSrc: Oral      Patients Stated Pain Goal: 6 ([id number])  Complications: No apparent anesthesia complications

## 2015-12-14 ENCOUNTER — Encounter (HOSPITAL_COMMUNITY): Payer: Self-pay | Admitting: Surgery

## 2015-12-14 DIAGNOSIS — D0512 Intraductal carcinoma in situ of left breast: Secondary | ICD-10-CM | POA: Diagnosis not present

## 2015-12-14 LAB — GLUCOSE, CAPILLARY
GLUCOSE-CAPILLARY: 126 mg/dL — AB (ref 65–99)
Glucose-Capillary: 126 mg/dL — ABNORMAL HIGH (ref 65–99)

## 2015-12-14 MED ORDER — ONDANSETRON 4 MG PO TBDP: 4.0000 mg | ORAL_TABLET | Freq: Four times a day (QID) | ORAL | 0 refills | Status: AC | PRN

## 2015-12-14 MED ORDER — OXYCODONE HCL 5 MG PO TABS: 5.0000 mg | ORAL_TABLET | ORAL | 0 refills | Status: AC | PRN

## 2015-12-14 NOTE — Progress Notes (Signed)
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 She will need assistance at home to do this, pt states her boyfriend has had 2 CVAs himself, pt unable to tell nurse his deficits, pt states he will be able to help her at home. Asked pt to have him come to the hospital so we can teach him also how to care for drains, pt states her brother is the one coming to take her home, boyfriend not coming. Plan to educate brother on drains if willing and he and pt can show boyfriend how to care for and empty drains.  MD also ordered [facility] to come to pt's home, this info was related to unit case manager and will arrange. Pt would definitely benefit from [facility] to ensure pt and boyfriend managing JP drains correctly and assessing incisions for any problems--don't feel confident on pt's ability to manage alone, and cannot assess boyfriend's ability.

## 2015-12-14 NOTE — Discharge Instructions (Signed)
[facility] [place] °[phone] ° °MASTECTOMY: POST OP INSTRUCTIONS ° °Always review your discharge instruction sheet given to you by the facility where your surgery was performed. °IF YOU HAVE DISABILITY OR FAMILY LEAVE FORMS, YOU MUST BRING THEM TO THE OFFICE FOR PROCESSING.   °DO NOT GIVE THEM TO YOUR DOCTOR. °A prescription for pain medication may be given to you upon discharge.  Take your pain medication as prescribed, if needed.  If narcotic pain medicine is not needed, then you may take acetaminophen (Tylenol) or ibuprofen (Advil) as needed. °1. Take your usually prescribed medications unless otherwise directed. °2. If you need a refill on your pain medication, please contact your pharmacy.  They will contact our office to request authorization.  Prescriptions will not be filled after 5pm or on week-ends. °3. You should follow a light diet the first few days after arrival home, such as soup and crackers, etc.  Resume your normal diet the day after surgery. °4. Most patients will experience some swelling and bruising on the chest and underarm.  Ice packs will help.  Swelling and bruising can take several days to resolve.  °5. It is common to experience some constipation if taking pain medication after surgery.  Increasing fluid intake and taking a stool softener (such as Colace) will usually help or prevent this problem from occurring.  A mild laxative (Milk of Magnesia or Miralax) should be taken according to package instructions if there are no bowel movements after 48 hours. °6. Unless discharge instructions indicate otherwise, leave your bandage dry and in place until your next appointment in 3-5 days.  You may take a limited sponge bath.  No tube baths or showers until the drains are removed.  You may have steri-strips (small skin tapes) in place directly over the incision.  These strips should be left on the skin for 7-10 days.  If your surgeon used skin glue on the incision, you may  shower in 24 hours.  The glue will flake off over the next 2-3 weeks.  Any sutures or staples will be removed at the office during your follow-up visit. °7. DRAINS:  If you have drains in place, it is important to keep a list of the amount of drainage produced each day in your drains.  Before leaving the hospital, you should be instructed on drain care.  Call our office if you have any questions about your drains. °8. ACTIVITIES:  You may resume regular (light) daily activities beginning the next day--such as daily self-care, walking, climbing stairs--gradually increasing activities as tolerated.  You may have sexual intercourse when it is comfortable.  Refrain from any heavy lifting or straining until approved by your doctor. °a. You may drive when you are no longer taking prescription pain medication, you can comfortably wear a seatbelt, and you can safely maneuver your car and apply brakes. °b. RETURN TO WORK:  __________________________________________________________ °9. You should see your doctor in the office for a follow-up appointment approximately 3-5 days after your surgery.  Your doctor’s nurse will typically make your follow-up appointment when she calls you with your pathology report.  Expect your pathology report 2-3 business days after your surgery.  You may call to check if you do not hear from us after three days.   °10. OTHER INSTRUCTIONS: ______________________________________________________________________________________________ ____________________________________________________________________________________________ °WHEN TO CALL YOUR DOCTOR: °1. Fever over 101.0 °2. Nausea and/or vomiting °3. Extreme swelling or bruising °4. Continued bleeding from incision. °5. Increased pain, redness, or drainage from the incision. °  The clinic staff is available to answer your questions during regular business hours.  Please don’t hesitate to call and ask to speak to one of the nurses for clinical  concerns.  If you have a medical emergency, go to the nearest emergency room or call 911.  A surgeon from [facility] is always on call at the hospital. °[address], [address], [place], [place]  [address] ? [address] [address], [place], [place]   [address] °[phone] ? [phone] ? FAX [phone] °Web site: www.[facility] ° ° °.Bulb Drain Home Care °A bulb drain consists of a thin rubber tube and a soft, round bulb that creates a gentle suction. The rubber tube is placed in the area where you had surgery. A bulb is attached to the end of the tube that is outside the body. The bulb drain removes excess fluid that normally builds up in a surgical wound after surgery. The color and amount of fluid will vary. Immediately after surgery, the fluid is bright red and is a little thicker than water. It may gradually change to a yellow or pink color and become more thin and water-like. When the amount decreases to about 1 or 2 tbsp in 24 hours, your health care provider will usually remove it. °DAILY CARE °· Keep the bulb flat (compressed) at all times, except while emptying it. The flatness creates suction. You can flatten the bulb by squeezing it firmly in the middle and then closing the cap. °· Keep sites where the tube enters the skin dry and covered with a bandage (dressing). °· Secure the tube 1-2 in (2.5-5.1 cm) below the insertion sites to keep it from pulling on your stitches. The tube is stitched in place and will not slip out. °· Secure the bulb as directed by your health care provider. °· For the first 3 days after surgery, there usually is more fluid in the bulb. Empty the bulb whenever it becomes half full because the bulb does not create enough suction if it is too full. The bulb could also overflow. Write down how much fluid you remove each time you empty your drain. Add up the amount removed in 24 hours. °· Empty the bulb at the same time every day once the amount of fluid decreases and you  only need to empty it once a day. Write down the amounts and the 24-hour totals to give to your health care provider. This helps your health care provider know when the tubes can be removed. °EMPTYING THE BULB DRAIN °Before emptying the bulb, get a measuring cup, a piece of paper and a pen, and wash your hands. °· Gently run your fingers down the tube (stripping) to empty any drainage from the tubing into the bulb. This may need to be done several times a day to clear the tubing of clots and tissue. °· Open the bulb cap to release suction, which causes it to inflate. Do not touch the inside of the cap. °· Gently run your fingers down the tube (stripping) to empty any drainage from the tubing into the bulb. °· Hold the cap out of the way, and pour fluid into the measuring cup.   °· Squeeze the bulb to provide suction.  °· Replace the cap.   °· Check the tape that holds the tube to your skin. If it is becoming loose, you can remove the loose piece of tape and apply a new one. Then, pin the bulb to your shirt.   °· Write down   down the amount of fluid you emptied out. Write down the date and each time you emptied your bulb drain. (If there are 2 bulbs, note the amount of drainage from each bulb and keep the totals separate. Your health care provider will want to know the total amounts for each drain and which tube is draining more.)   Flush the fluid down the toilet and wash your hands.   Call your health care provider once you have less than 2 tbsp of fluid collecting in the bulb drain every 24 hours. If there is drainage around the tube site, change dressings and keep the area dry. Cleanse around tube with sterile saline and place dry gauze around site. This gauze should be changed when it is soiled. If it stays clean and unsoiled, it should still be changed daily.  SEEK MEDICAL CARE IF:  Your drainage has a bad smell or is cloudy.   You have a fever.   Your drainage is increasing instead of decreasing.    Your tube fell out.   You have redness or swelling around the tube site.   You have drainage from a surgical wound.   Your bulb drain will not stay flat after you empty it.  MAKE SURE YOU:   Understand these instructions.  Will watch your condition.  Will get help right away if you are not doing well or get worse.   This information is not intended to replace advice given to you by your health care provider. Make sure you discuss any questions you have with your health care provider.   Document Released: 04/18/2000 Document Revised: 05/12/2014 Document Reviewed: 11/08/2014 Elsevier Interactive Patient Education [employer].

## 2015-12-14 NOTE — Progress Notes (Signed)
1 Day Post-Op  Subjective: Pt doing well   Objective: Vital signs in last 24 hours: Temp:  [97.3 F (36.3 C)-98.1 F (36.7 C)] 98 F (36.7 C) (08/11 0510) Pulse Rate:  [44-57] 49 (08/11 0510) Resp:  [12-17] 15 (08/11 0510) BP: (119-152)/(69-82) 125/72 (08/11 0510) SpO2:  [96 %-100 %] 99 % (08/11 0510) FiO2 (%):  [2 %] 2 % (08/10 1400) Last BM Date:  (Not yet)  Intake/Output from previous day: 08/10 0701 - 08/11 0700 In: 1798.3 [I.V.:1698.3; IV Piggyback:100] Out: 1295 [Urine:1050; Drains:195; Blood:50] Intake/Output this shift: Total I/O In: -  Out: 300 [Urine:300]  Incision/Wound:CDI no hematoma   Lab Results:   Recent Labs  12/13/15 1251  WBC 7.7  HGB 15.5*  HCT 42.7  PLT 239   BMET  Recent Labs  12/13/15 1251  CREATININE 0.97   PT/INR No results for input(s): LABPROT, INR in the last 72 hours. ABG No results for input(s): PHART, HCO3 in the last 72 hours.  Invalid input(s): PCO2, PO2  Studies/Results: Nm Sentinel Node Inj-no Rpt (breast)  Result Date: 12/13/2015 CLINICAL DATA: left breast cancer Sulfur colloid was injected intradermally by the nuclear medicine technologist for breast cancer sentinel node localization.    Anti-infectives: Anti-infectives    Start     Dose/Rate Route Frequency Ordered Stop   12/13/15 1600  ceFAZolin (ANCEF) IVPB 2g/100 mL premix     2 g 200 mL/hr over 30 Minutes Intravenous Every 8 hours 12/13/15 1020 12/13/15 1630   12/13/15 0630  ceFAZolin (ANCEF) IVPB 2g/100 mL premix     2 g 200 mL/hr over 30 Minutes Intravenous To [facility] 12/12/15 1308 12/13/15 0757      Assessment/Plan: s/p Procedure(s): LEFT SIMPLE MASTECTOMY WITH AXILLARY SENTINEL NODE BIOPSY (Left) Discharge  WILL need HHN  For drain care   LOS: 1 day    Lakina Mcintire A. 12/14/2015

## 2015-12-14 NOTE — Discharge Summary (Signed)
Physician Discharge Summary  Patient ID: Julie Parker MRN: [id number] DOB/AGE: March 01, 1956 60 y.o.  Admit date: 12/13/2015 Discharge date: 12/14/2015  Admission Diagnoses: LEFT BREAST CANCER   Discharge Diagnoses:  Active Problems:   Breast cancer, left ([place])   Breast cancer ([place])   Discharged Condition: good  Hospital Course: PT DID WELL TOLERATED DIET WOUND WAS CLEAN GOOD CONTROL OF HER BLOOD PRESSURE   Consults: None    Treatments: surgery: LEFT SIMPLE MASTECTOMY   /SLN MAPPING   Discharge Exam: Blood pressure 125/72, pulse (!) 49, temperature 98 F (36.7 C), resp. rate 15, height 5\' 5"  (1.651 m), weight 93.4 kg (206 lb), SpO2 99 %. General appearance: alert and cooperative Resp: clear to auscultation bilaterally Cardio: regular rate and rhythm, S1, S2 normal, no murmur, click, rub or gallop Incision/Wound:CDI no hematoma  Flaps viable   Disposition: 01-Home or Self Care  Discharge Instructions    Diet - low sodium heart healthy    Complete by:  As directed   Increase activity slowly    Complete by:  As directed       Medication List    TAKE these medications   amLODipine 10 MG tablet Commonly known as:  NORVASC Take 1 tablet by mouth daily.   aspirin 81 MG EC tablet Take 1 tablet by mouth daily.   atenolol 50 MG tablet Commonly known as:  TENORMIN Take 1 tablet by mouth daily.   baclofen 10 MG tablet Commonly known as:  LIORESAL Take 1 tablet by mouth 3 (three) times daily as needed.   benazepril 20 MG tablet Commonly known as:  LOTENSIN Take 20 mg by mouth daily.   chlorthalidone 25 MG tablet Commonly known as:  HYGROTON Take 1 tablet by mouth daily.   clopidogrel 75 MG tablet Commonly known as:  PLAVIX Take 1 tablet by mouth daily.   LORazepam 0.5 MG tablet Commonly known as:  ATIVAN Take 1 tablet by mouth daily as needed for anxiety.   ondansetron 4 MG disintegrating tablet Commonly known as:  ZOFRAN-ODT Take 1 tablet (4 mg total) by  mouth every 6 (six) hours as needed for nausea.   oxyCODONE 5 MG immediate release tablet Commonly known as:  Oxy IR/ROXICODONE Take 1-2 tablets (5-10 mg total) by mouth every 4 (four) hours as needed for moderate pain.        Signed: Tamim Skog A. 12/14/2015, 8:51 AM

## 2015-12-14 NOTE — Care Management Note (Signed)
Case Management Note  Patient Details  Name: Julie Parker MRN: [id number] Date of Birth: [birth year]  Subjective/Objective:                    Action/Plan:   Expected Discharge Date:                  Expected Discharge Plan:  [place]  In-House Referral:     Discharge planning Services  CM Consult  Post Acute Care Choice:  Home Health Choice offered to:  Patient  DME Arranged:    DME Agency:     HH Arranged:  RN [place] Agency:  [place]  Status of Service:  Completed, signed off  If discussed at [place] of Stay Meetings, dates discussed:    Additional Comments:  Marilu Favre, RN 12/14/2015, 11:03 AM

## 2015-12-14 NOTE — Progress Notes (Signed)
Teaching of JP care done with pt and her brother. Brother able to demonstrate how to empty and recharge and how to strip the JP tubing as per d/c instructions given by MD in pt d/c info packet. Also pt able to measure the drainage amount with containers provided and set up paper for pt and brother to record output.  Pt's brother will instruct pt's boyfriend and HHRN to contact pt and come out to her home for continued instruction of JP drain with pt and boyfriend and assess drains and incision.    Copy of instructions and 1 script given to pt, other script sent into pt's pharmacy by MD, pt informed.  Pt d/c'd via wheelchair with belongings with brother, escorted by hospital volunteer.

## 2016-01-03 ENCOUNTER — Telehealth (HOSPITAL_COMMUNITY): Payer: Self-pay | Admitting: Emergency Medicine

## 2016-01-03 NOTE — Telephone Encounter (Signed)
Called to introduce myself to pt

## 2016-01-11 ENCOUNTER — Encounter (HOSPITAL_COMMUNITY): Payer: Self-pay

## 2016-01-15 ENCOUNTER — Encounter (HOSPITAL_COMMUNITY): Payer: Self-pay | Admitting: Hematology & Oncology

## 2016-01-15 ENCOUNTER — Encounter (HOSPITAL_COMMUNITY): Payer: Self-pay | Admitting: Lab

## 2016-01-15 ENCOUNTER — Encounter (HOSPITAL_COMMUNITY): Payer: Medicare HMO | Attending: Hematology & Oncology | Admitting: Hematology & Oncology

## 2016-01-15 VITALS — BP 149/80 | HR 56 | Temp 98.1°F | Resp 16 | Ht 65.0 in | Wt 201.5 lb

## 2016-01-15 DIAGNOSIS — Z8673 Personal history of transient ischemic attack (TIA), and cerebral infarction without residual deficits: Secondary | ICD-10-CM | POA: Diagnosis not present

## 2016-01-15 DIAGNOSIS — C50912 Malignant neoplasm of unspecified site of left female breast: Secondary | ICD-10-CM

## 2016-01-15 DIAGNOSIS — C50212 Malignant neoplasm of upper-inner quadrant of left female breast: Secondary | ICD-10-CM | POA: Diagnosis not present

## 2016-01-15 DIAGNOSIS — Z720 Tobacco use: Secondary | ICD-10-CM | POA: Diagnosis not present

## 2016-01-15 DIAGNOSIS — C50412 Malignant neoplasm of upper-outer quadrant of left female breast: Secondary | ICD-10-CM

## 2016-01-15 DIAGNOSIS — I6930 Unspecified sequelae of cerebral infarction: Secondary | ICD-10-CM

## 2016-01-15 NOTE — Progress Notes (Unsigned)
Referral sent to Dr Arnoldo Morale for port at 9/21 @3 .  Records faxed on 9/12

## 2016-01-15 NOTE — Progress Notes (Signed)
START OFF PATHWAY REGIMEN - Breast  Off Pathway: CMF (IV cyclophosphamide) q21 days  [id number]:CMF (IV cyclophosphamide) q21 days:   A cycle is every 21 days:     Cyclophosphamide (Cytoxan(R)) 600 mg/m2 in 250 mL NS IV over 60 minutes on day 1 only Dose Mod: None     Methotrexate 40 mg/m2 in 50 mL NS IV given over 15 minutes on day 1 only Dose Mod: None     5-Fluorouracil 600 mg/m2 in 50 mL NS IV given over 15 minutes on day 1 only Dose Mod: None Additional Orders: Risk of febrile neutropenia is considered to be < 10% (low). Goldhirsch et al.  Annals of Oncology [phone].  **Always confirm dose/schedule in your pharmacy ordering system**    Patient Characteristics: Adjuvant Therapy, Node Positive (1-3), HER2/neu Negative/Unknown/Equivocal, ER Positive, MammaPrint(R) Ordered, High Genomic Risk AJCC Stage Grouping: IIB Current Disease Status: No Distant Mets or Local Recurrence AJCC M Stage: 0 ER Status: Positive (+) AJCC N Stage: 1 AJCC T Stage: 2 HER2/neu: Negative (-) PR Status: Positive (+) Node Status: Positive (+) (1-3 Nodes) Has this patient completed genomic testing? Yes - MammaPrint(R) MammaPrint(R) Score: High Genomic Risk  Intent of Therapy: Curative Intent, Discussed with Patient

## 2016-01-15 NOTE — Progress Notes (Signed)
Met with pt face to face.  Introduced myself and explain a little bit about my role as the patient navigator.  Pt given a card with all my information on it.  Told pt to call if they had any questions or concerns.  Pt verbalized understanding.

## 2016-01-15 NOTE — Patient Instructions (Signed)
[facility] at [facility] Discharge Instructions  RECOMMENDATIONS MADE BY THE CONSULTANT AND ANY TEST RESULTS WILL BE SENT TO YOUR REFERRING PHYSICIAN.  You saw Dr. Penland today.  Thank you for choosing [facility] at [facility] to provide your oncology and hematology care.  To afford each patient quality time with our provider, please arrive at least 15 minutes before your scheduled appointment time.   Beginning January 23rd 2017 lab work for the Cancer Center will be done in the  Main lab at [place] on 1st floor. If you have a lab appointment with the Cancer Center please come in thru the  Main Entrance and check in at the main information desk  You need to re-schedule your appointment should you arrive 10 or more minutes late.  We strive to give you quality time with our providers, and arriving late affects you and other patients whose appointments are after yours.  Also, if you no show three or more times for appointments you may be dismissed from the clinic at the providers discretion.     Again, thank you for choosing [facility].  Our hope is that these requests will decrease the amount of time that you wait before being seen by our physicians.       _____________________________________________________________  Should you have questions after your visit to [facility], please contact our office at [phone] between the hours of 8:30 a.m. and 4:30 p.m.  Voicemails left after 4:30 p.m. will not be returned until the following business day.  For prescription refill requests, have your pharmacy contact our office.         Resources For Cancer Patients and their Caregivers ? American Cancer Society: Can assist with transportation, wigs, general needs, runs Look Good Feel Better.        1-888-227-6333 ? Cancer Care: Provides financial assistance, online support groups, medication/co-pay assistance.   1-800-813-HOPE (4673) ? [facility] Assists [place] cancer patients and their families through emotional , educational and financial support.  336-427-4357 ? [place] DSS Where to apply for food stamps, Medicaid and utility assistance. 336-342-1394 ? [facility]: Transportation to medical appointments. 336-347-2287 ? Social Security Administration: May apply for disability if have a Stage IV cancer. 336-342-7796 1-800-772-1213 ? [place] Aging, Disability and Transit Services: Assists with nutrition, care and transit needs. 336-349-2343  Cancer Center Support Programs: @10RELATIVEDAYS@ > Cancer Support Group  2nd Tuesday of the month 1pm-2pm, Journey Room  > Creative Journey  3rd Tuesday of the month 1130am-1pm, Journey Room  > Look Good Feel Better  1st Wednesday of the month 10am-12 noon, Journey Room (Call American Cancer Society to register 1-800-395-5775)    

## 2016-01-15 NOTE — Progress Notes (Signed)
[facility]  CONSULT NOTE  Patient Care Team: Pcp Not In System as PCP - General  CHIEF COMPLAINTS/PURPOSE OF CONSULTATION:  pT2pN1 multifocal carcinoma of the L breast, ER+ PR+ HER 2 -  Mastectomy HX CVA Tobacco Use High risk Mammprint  HISTORY OF PRESENTING ILLNESS:  Julie Parker 60 y.o. female is here for consultation of recently diagnosed multifocal carcinoma of the L breast. Disease is ER+ PR+ (100%, 5%) and HER 2 -.She has undergone a mastectomy with Dr. Brantley Stage. She had previously seen Dr. Tressie Stalker in consultation. Mammaprint has been sent and noted to be high risk. She presents for additional discussion of therapy. Julie Parker notes that she would like to try any therapy that may reduced the changes of her "cancer coming back."  Patient currently lives with her boyfriend. She has had two strokes before (one in 2011 and one in 2012) and now has difficulty walking. She walks with a cane. She does not work and cannot drive due to her disability. Thus, it is difficult for her to get around. She notes that she depends upon [employer] or others to help her with transportation. Travel to [place] is difficult for her.   Patient felt multiple lumps in her breast. She told [name] who said she needed a mammogram. She had not had a preceeding mammogram since 2008. After she had the mammogram, a biopsy was conducted and a surgery was preformed. Julie Parker says she was never told that she needed chemotherapy or radiation. She notes that she does want treatment that may "help her."  Julie Parker says her boyfriend can help support her if she does need chemotherapy.  Julie Parker's appetite is normal. She does not sleep well at night because she sleeps a lot during the day. She is up a lot at night.  She currently does not have a cardiologist or a neurologist.  She notes that she did well with her surgery. She denies any pain.     MEDICAL HISTORY:  Past Medical History:  Diagnosis Date  . Arthritis   .  Cancer [facility])    left breast cancer  . Hypertension   . Pneumonia   . Stroke [facility])    2 strokes, right side weakness    SURGICAL HISTORY: Past Surgical History:  Procedure Laterality Date  . ABDOMINAL HYSTERECTOMY    . FOOT SURGERY Right   . SIMPLE MASTECTOMY WITH AXILLARY SENTINEL NODE BIOPSY Left 12/13/2015   Procedure: LEFT SIMPLE MASTECTOMY WITH AXILLARY SENTINEL NODE BIOPSY;  Surgeon: Erroll Luna, MD;  Location: [facility];  Service: General;  Laterality: Left;  . TUBAL LIGATION      SOCIAL HISTORY: Social History   Social History  . Marital status: Widowed    Spouse name: N/A  . Number of children: N/A  . Years of education: N/A   Occupational History  . Not on file.   Social History Main Topics  . Smoking status: Current Every Day Smoker    Packs/day: 0.25    Years: 20.00    Types: Cigarettes  . Smokeless tobacco: Never Used  . Alcohol use No  . Drug use: No  . Sexual activity: Not on file   Other Topics Concern  . Not on file   Social History Narrative  . No narrative on file   She currently smokes but is trying to quit. She started smoking when she was about 21. She smokes 4-5 packs a day.  She does not drink alcohol.  She enjoys  playing sports, playing spades, watching tv. She does not work because she is disabled. She has four brother and three sisters.   FAMILY HISTORY: Family History  Problem Relation Age of Onset  . Hypertension Mother   . Diabetes Mother   . Other Father     sleeping disease   Dad passed away in his sleep around age 24 years old.  Mom is still living. She is about 69 years old. Mother (Julie Parker)   ALLERGIES:  is allergic to no known allergies.  MEDICATIONS:  Current Outpatient Prescriptions  Medication Sig Dispense Refill  . amLODipine (NORVASC) 10 MG tablet Take 1 tablet by mouth daily.    Marland Kitchen aspirin 81 MG EC tablet Take 1 tablet by mouth daily.    Marland Kitchen atenolol (TENORMIN) 50 MG tablet Take 1 tablet by mouth daily.     . baclofen (LIORESAL) 10 MG tablet Take 1 tablet by mouth 3 (three) times daily as needed.    . benazepril (LOTENSIN) 20 MG tablet Take 20 mg by mouth daily.     . chlorthalidone (HYGROTON) 25 MG tablet Take 1 tablet by mouth daily.    . clopidogrel (PLAVIX) 75 MG tablet Take 1 tablet by mouth daily.    Marland Kitchen LORazepam (ATIVAN) 0.5 MG tablet Take 1 tablet by mouth daily as needed for anxiety.     . ondansetron (ZOFRAN-ODT) 4 MG disintegrating tablet Take 1 tablet (4 mg total) by mouth every 6 (six) hours as needed for nausea. 20 tablet 0  . oxyCODONE (OXY IR/ROXICODONE) 5 MG immediate release tablet Take 1-2 tablets (5-10 mg total) by mouth every 4 (four) hours as needed for moderate pain. 30 tablet 0   No current facility-administered medications for this visit.     Review of Systems  Constitutional: Negative.   HENT: Negative.   Eyes: Negative.   Respiratory: Negative.   Cardiovascular: Negative.   Gastrointestinal: Negative.   Genitourinary: Negative.   Musculoskeletal: Negative.   Skin: Negative.   Neurological: Positive for sensory change, speech change and focal weakness.  Endo/Heme/Allergies: Negative.   Psychiatric/Behavioral: Positive for memory loss. The patient has insomnia.   All other systems reviewed and are negative. 14 point ROS was done and is otherwise as detailed above or in HPI   PHYSICAL EXAMINATION: ECOG PERFORMANCE STATUS: 1 - Symptomatic but completely ambulatory  Vitals:   01/15/16 1545  BP: (!) 149/80  Pulse: (!) 56  Resp: 16  Temp: 98.1 F (36.7 C)   Filed Weights   01/15/16 1545  Weight: 201 lb 8 oz (91.4 kg)     Physical Exam  Constitutional: She is oriented to person, place, and time and well-developed, well-nourished, and in no distress.  HENT:  Head: Normocephalic and atraumatic.  Nose: Nose normal.  Mouth/Throat: Oropharynx is clear and moist. No oropharyngeal exudate.  Eyes: Conjunctivae and EOM are normal. Pupils are equal, round,  and reactive to light. Right eye exhibits no discharge. Left eye exhibits no discharge. No scleral icterus.  Neck: Normal range of motion. Neck supple. No tracheal deviation present. No thyromegaly present.  Cardiovascular: Normal rate, regular rhythm and normal heart sounds.  Exam reveals no gallop and no friction rub.   No murmur heard. Pulmonary/Chest: Effort normal and breath sounds normal. She has no wheezes. She has no rales.    Abdominal: Soft. Bowel sounds are normal. She exhibits no distension and no mass. There is no tenderness. There is no rebound and no guarding.  Musculoskeletal: Normal range  of motion. She exhibits no edema.  Lymphadenopathy:    She has no cervical adenopathy.  Neurological: She is alert and oriented to person, place, and time. No cranial nerve deficit. She exhibits abnormal muscle tone. Coordination abnormal.  Assistance onto exam table, weakness of both RUE and RLE  Skin: Skin is warm and dry. No rash noted.  Psychiatric: Mood, memory, affect and judgment normal.  Nursing note and vitals reviewed.     LABORATORY DATA:  I have reviewed the data as listed Lab Results  Component Value Date   WBC 7.7 12/13/2015   HGB 15.5 (H) 12/13/2015   HCT 42.7 12/13/2015   MCV 85.6 12/13/2015   PLT 239 12/13/2015   CMP     Component Value Date/Time   NA 135 12/06/2015 1620   K 3.5 12/06/2015 1620   CL 104 12/06/2015 1620   CO2 22 12/06/2015 1620   GLUCOSE 102 (H) 12/06/2015 1620   BUN 11 12/06/2015 1620   CREATININE 0.97 12/13/2015 1251   CALCIUM 9.4 12/06/2015 1620   PROT 6.9 10/30/2015 0848   ALBUMIN 3.7 10/30/2015 0848   AST 26 10/30/2015 0848   ALT 27 10/30/2015 0848   ALKPHOS 84 10/30/2015 0848   BILITOT 0.6 10/30/2015 0848   GFRNONAA >60 12/13/2015 1251   GFRAA >60 12/13/2015 1251     RADIOGRAPHIC STUDIES: I have personally reviewed the radiological images as listed and agreed with the findings in the report. No results found.  Study Result    CLINICAL DATA:  Recently diagnosed invasive ductal carcinoma in the 10 o'clock and 2 o'clock positions of the left breast. The patient also has a 1.5 x 1.1 x 0.4 cm group of microcalcifications suspicious for ductal carcinoma in situ in the upper-outer quadrant of the left breast. These have not been biopsied.  LABS:  Creatinine was obtained on site at [facility] at [address]Results: Creatinine 0.8 mg/dL.  EXAM: BILATERAL BREAST MRI WITH AND WITHOUT CONTRAST  TECHNIQUE: Multiplanar, multisequence MR images of both breasts were obtained prior to and following the intravenous administration of 20 ml of MultiHance.  THREE-DIMENSIONAL MR IMAGE RENDERING ON INDEPENDENT WORKSTATION:  Three-dimensional MR images were rendered by post-processing of the original MR data on an independent workstation. The three-dimensional MR images were interpreted, and findings are reported in the following complete MRI report for this study. Three dimensional images were evaluated at the independent DynaCad workstation  COMPARISON:  Current and previous mammogram, ultrasound and biopsy examinations.  FINDINGS: Breast composition: b. Scattered fibroglandular tissue.  Background parenchymal enhancement: Minimal  Right breast: No mass or abnormal enhancement.  Left breast: 2.1 x 2.0 x 1.1 cm oval, heterogeneous, enhancing mass in the posterior aspect of the upper inner quadrant. This has rapid wash-in/washout kinetics and corresponds to the recently biopsied invasive ductal carcinoma in the 10 o'clock position of the breast.  A 1.7 x 1.6 x 1.2 cm similar-appearing and similarly enhancing mass is demonstrated in the posterior aspect of the upper-outer quadrant of the left breast, corresponding to the recently biopsied invasive ductal carcinoma in the 2 o'clock position. This is 7 cm lateral to the 2.1 cm invasive ductal carcinoma in the 10 o'clock position.  In  the upper inner quadrant of the left breast, in the middle third, a 1.4 x 1.1 x 0.6 cm area of linear and nodular enhancement is demonstrated, consisting of 1 larger component 1 smaller component. This is 5 cm anterior, slightly lateral and slightly inferior to the  2.1 cm invasive ductal carcinoma in the 10 o'clock position of the breast.  2.5 cm anterior and slightly medial to the 1.7 cm biopsy-proven invasive ductal carcinoma in the 2 o'clock position of the left breast, a 2.2 x 1.0 x 0.4 cm area of clumped, linear enhancement is demonstrated. This has a mixture of enhancement kinetics, including rapid wash-in/washout, plateau and persistent kinetics. This corresponds to the location of the recently evaluated calcifications suspicious for ductal carcinoma in situ.  Lymph nodes: No abnormal appearing lymph nodes.  Ancillary findings:  None.  IMPRESSION: 1. 2.1 cm biopsy-proven invasive ductal carcinoma in the 10 o'clock position of the left breast. 2. 1.7 cm biopsy-proven invasive ductal carcinoma in the 2 o'clock position of the left breast. 3. 1.4 cm area of nodular and linear enhancement in the upper inner quadrant of the right breast suspicious for ductal carcinoma in situ and possible invasive ductal carcinoma. 4. 2.2 cm area of clumped, linear non mass enhancement in the upper-outer quadrant of the left breast at the location of recently demonstrated suspicious calcifications. This has imaging features suspicious for ductal carcinoma in situ. 5. No evidence of malignancy on the right and no adenopathy.  RECOMMENDATION: Treatment plan.  BI-RADS CATEGORY  6: Known biopsy-proven malignancy.  Electronically Signed: By: Claudie Revering M.D. On: 08/20/2015 12:05    PATHOLOGY:            ASSESSMENT & PLAN:  Multifocal carcinoma L breast ER+ PR+ ( 5%, 100%) HER 2 -  Carcinoma of the upper outer quadrant of the L breast Carcinoma of the upper inner  quadrant of the L breast pT2pN1 High Risk Mammaprint HX CVA with R sided weakness Hysterectomy Tobacco Use  We reviewed her pathology. We discussed her MammaPrint. Lazariah is able to understand the results/implications of her mammaprint. She is definitely interested in pursuing any therapy she feels that she could tolerate. She is interested in endocrine therapy.   I explained to the patient that she would benefit from chemotherapy. My only concern is whether she will be able to tolerate it given her previous strokes and what appears to be limited support. She says she has friends that can help support her and that she would like to try chemotherapy.   I do feel she could tolerate CMF. She will receive benefit from this regimen in reducing recurrence, we discussed this in detail. I will also refer her to XRT in [place] at the completion of chemotherapy given her node positivity. She will require endocrine therapy.  I will try to get her stroke records from [place]. She should continue her aspirin and plavix. CT scans obtained as staging from [facility] will be obtained as well.   Yaneth will need chemotherapy teaching. We discussed port placement. I showed her pictures of port a caths and explained their use. She notes it is hard to get to [place]. We can try to arrange this locally for her.   She will meet Anderson Malta today, our patient navigator.   Smoking cessation was addressed in detail. We will work with her on this issue moving forward.   Orders Placed This Encounter  Procedures  . CBC with Differential    Standing Status:   Standing    Number of Occurrences:   20    Standing Expiration Date:   01/16/2017  . Comprehensive metabolic panel    Standing Status:   Standing    Number of Occurrences:   20    Standing Expiration Date:  01/16/2017    All questions were answered. The patient knows to call the clinic with any problems, questions or concerns.  This document serves as a record of  services personally performed by Ancil Linsey, MD. It was created on her behalf by Elmyra Ricks, a trained medical scribe. The creation of this record is based on the scribe's personal observations and the provider's statements to them. This document has been checked and approved by the attending provider.  I have reviewed the above documentation for accuracy and completeness, and I agree with the above.  This note was electronically signed.    Molli Hazard, MD  01/15/2016 4:34 PM

## 2016-01-16 ENCOUNTER — Other Ambulatory Visit (HOSPITAL_COMMUNITY): Payer: Self-pay | Admitting: Oncology

## 2016-01-16 ENCOUNTER — Telehealth (HOSPITAL_COMMUNITY): Payer: Self-pay | Admitting: Emergency Medicine

## 2016-01-16 MED ORDER — PROCHLORPERAZINE MALEATE 10 MG PO TABS: 10.0000 mg | ORAL_TABLET | Freq: Four times a day (QID) | ORAL | 1 refills | Status: AC | PRN

## 2016-01-16 MED ORDER — DEXAMETHASONE 4 MG PO TABS: 8.0000 mg | ORAL_TABLET | Freq: Every day | ORAL | 1 refills | Status: AC

## 2016-01-16 MED ORDER — ONDANSETRON HCL 8 MG PO TABS: 8.0000 mg | ORAL_TABLET | Freq: Two times a day (BID) | ORAL | 1 refills | Status: AC | PRN

## 2016-01-16 MED ORDER — LIDOCAINE-PRILOCAINE 2.5-2.5 % EX CREA: TOPICAL_CREAM | CUTANEOUS | 3 refills | Status: AC

## 2016-01-16 NOTE — Telephone Encounter (Signed)
Told pt about her port consult with Dr Arnoldo Morale on 01/24/2016 at 2:00pm and chemmotherapy teaching on 01/23/2016 at 11:30pm.  Told to come to the cancer center (4th floor) ad we would take her over for the teaching.  Pt verbalized understanding.

## 2016-01-17 ENCOUNTER — Other Ambulatory Visit (HOSPITAL_COMMUNITY): Payer: Self-pay | Admitting: Hematology & Oncology

## 2016-01-17 ENCOUNTER — Other Ambulatory Visit (HOSPITAL_COMMUNITY): Payer: Self-pay | Admitting: Oncology

## 2016-01-18 ENCOUNTER — Other Ambulatory Visit (HOSPITAL_COMMUNITY): Payer: Self-pay | Admitting: Emergency Medicine

## 2016-01-18 NOTE — Patient Instructions (Signed)
[facility]   CHEMOTHERAPY INSTRUCTIONS   Premeds: Aloxi - high powered nausea/vomiting prevention medication used for chemotherapy patients. Dexamethasone - steroid - given to reduce the risk of you having an allergic type reaction to the chemotherapy. Dex can cause you to feel energized, nervous/anxious/jittery, make you have trouble sleeping, and/or make you feel hot/flushed in the face/neck and/or look pink/red in the face/neck. These side effects will pass as the Dex wears off. (takes 20 minutes to infuse)   Neulasta on pro - this medication is not chemo but being given because you have had chemo. It is usually given 24-27 hours after the completion of chemotherapy. This medication works by boosting your bone marrow's supply of white blood cells. White blood cells are what protect our bodies against infection. The medication is given in the form of a subcutaneous injection. It is given in the fatty tissue of your abdomen. It is a short needle. The major side effect of this medication is bone or muscle pain. The drug of choice to relieve or lessen the pain is Aleve or Ibuprofen. If a physician has ever told you not to take Aleve or Ibuprofen - then don't take it. You should then take Tylenol/acetaminophen. Take either medication as the bottle directs you to.  The level of pain you experience as a result of this injection can range from none, to mild or moderate, or severe. Please let us know if you develop moderate or severe bone pain.     SELF IMAGE NEEDS AND REFERRALS MADE: Information on look good feel better class     EDUCATIONAL MATERIALS GIVEN AND REVIEWED: Chemotherapy and you book, Nutrition book, Information on cyclophosphamide, methotrexate, and 5FU     SELF CARE ACTIVITIES WHILE ON CHEMOTHERAPY:  Increase your fluid intake 48 hours prior to treatment and drink at least 2 quarts (64 oz of water/decaff beverages) per day after treatment. No alcohol intake. No  aspirin or other medications unless approved by your oncologist. Eat foods that are light and easy to digest. Eat foods at cold or room temperature (as long as you aren't on the drug Oxaliplatin). No fried, fatty, or spicy foods immediately before or after treatment. Have teeth cleaned professionally before starting treatment. Keep dentures and partial plates clean. Use soft toothbrush and do not use mouthwashes that contain alcohol. Biotene is a good mouthwash that is available at most pharmacies or may be ordered by calling [phone]. Use warm salt water gargles (1 teaspoon salt per 1 quart warm water) before and after meals and at bedtime. Or you may rinse with 2 tablespoons of three-percent hydrogen peroxide mixed in eight ounces of water. Always use sunscreen that has not expired and with SPF (Sun Protection Factor) of 50 or higher. Wear hats to protect your head from the sun. Remember to use sunscreen on your hands, ears, face, & feet. Use your nausea medication as directed to prevent nausea. Use your stool softener or laxative as directed to prevent constipation. Use your anti-diarrheal medication as directed to stop diarrhea.   Please wash your hands for at least 30 seconds using warm soapy water. Handwashing is the #1 way to prevent the spread of germs. Stay away from sick people or people who are getting over a cold. If you develop respiratory systems such as green/yellow mucus production or productive cough or persistent cough let us know and we will see if you need an antibiotic. It is a good idea to keep a pair  of gloves on when going into grocery stores/[employer] to decrease your risk of coming into contact with germs on the carts, etc. Carry alcohol hand gel with you at all times and use it frequently if out in public. All foods need to be cooked thoroughly. No raw foods. No medium or undercooked meats, eggs. If your food is cooked medium well, it does not need to be hot pink or saturated with  bloody liquid at all. Vegetables and fruits need to be washed/rinsed under the faucet with a dish detergent before being consumed. You can eat raw fruits and vegetables unless we tell you otherwise but it would be best if you cooked them or bought frozen. Do not eat off of salad bars or hot bars unless you really trust the cleanliness of the restaurant. If you need dental work, please let Dr. Whitney Muse know before you go for your appointment so that we can coordinate the best possible time for you in regards to your chemo regimen. You need to also let your dentist know that you are actively taking chemo. We may need to do labs prior to your dental appointment. We also want your bowels moving at least every other day. If this is not happening, we need to know so that we can get you on a bowel regimen to help you go. If you are going to have sex, a condom must be used to protect the person that isn't taking chemotherapy. Chemo can decrease your libido (sex drive).     MEDICATIONS:  Dexamethasone 4mg  tablet.  2 tablets (8 mg total) by mouth daily. Start the day after chemotherapy for 2 days. Take with food.                                                                                                                                                               Zofran/Ondansetron 8mg  tablet. Take 1 tablet every 8 hours as needed for nausea/vomiting. (#1 nausea med to take, this can constipate)  Compazine/Prochlorperazine 10mg  tablet. Take 1 tablet every 6 hours as needed for nausea/vomiting. (#2 nausea med to take, this can make you sleepy)   EMLA cream. Apply a quarter size amount to port site 1 hour prior to chemo. Do not rub in. Cover with plastic wrap.   Over-the-Counter Meds:  Miralax 1 capful in 8 oz of fluid daily. May increase to two times a day if needed. This is a stool softener. If this doesn't work proceed you can add:  Senokot S  - start with 1 tablet two times a day and increase to 4  tablets two times a day if needed. (total of 8 tablets in a 24 hour period). This is a stimulant laxative.   Call us if this does not help your bowels  move.   Imodium 2mg  capsule. Take 2 capsules after the 1st loose stool and then 1 capsule every 2 hours until you go a total of 12 hours without having a loose stool. Call the [facility] if loose stools continue. If diarrhea occurs @ bedtime, take 2 capsules @ bedtime. Then take 2 capsules every 4 hours until morning. Call [facility].    SYMPTOMS TO REPORT AS SOON AS POSSIBLE AFTER TREATMENT:  FEVER GREATER THAN 100.5 F  CHILLS WITH OR WITHOUT FEVER  NAUSEA AND VOMITING THAT IS NOT CONTROLLED WITH YOUR NAUSEA MEDICATION  UNUSUAL SHORTNESS OF BREATH  UNUSUAL BRUISING OR BLEEDING  TENDERNESS IN MOUTH AND THROAT WITH OR WITHOUT PRESENCE OF ULCERS  URINARY PROBLEMS  BOWEL PROBLEMS  UNUSUAL RASH    (Please refer to/review other teaching materials that have been provided to you in this blue folder - What to Know During Chemo, What to know After Chemo, Dr. Donald Pore Advice, Constipation Sheet, Diarrhea Sheet, Nausea Sheet, Self Care Activities While on Chemo)     Wear comfortable clothing and clothing appropriate for easy access to any Portacath or PICC line. Let us know if there is anything that we can do to make your therapy better!      I have been informed and understand all of the instructions given to me and have received a copy. I have been instructed to call the clinic [phone]  or my family physician as soon as possible for continued medical care, if indicated. I do not have any more questions at this time but understand that I may call the [facility] at [phone] during office hours should I have questions or need assistance in obtaining follow-up care.

## 2016-01-21 ENCOUNTER — Encounter (HOSPITAL_COMMUNITY): Payer: Self-pay | Admitting: Emergency Medicine

## 2016-01-21 NOTE — Progress Notes (Signed)
01/18/2016- teaching pulled together on cyclophosphamid, methotrexate, and 5FU, pre-meds sent into [facility] in [place], labs entered, chemo/doctor schedule made.

## 2016-01-22 ENCOUNTER — Other Ambulatory Visit (HOSPITAL_COMMUNITY): Payer: Self-pay | Admitting: Oncology

## 2016-01-23 ENCOUNTER — Encounter (HOSPITAL_COMMUNITY): Payer: Medicare HMO

## 2016-01-27 NOTE — H&P (Signed)
SURGICAL HISTORY & PHYSICAL  HISTORY OF PRESENT ILLNESS (HPI):  60 y.o. female presented upon referral from medical oncology for placement of a tunneled central venous catheter with subcutaneous port for chemotherapy. Patient was diagnosed with invasive ductal carcinoma of the Left breast, underwent Left mastectomy, and pathology testing revealed tumor subtype that would benefit from systemic chemotherapy and Tamoxifen. Patient denies any unilateral upper extremity swelling, fever/chills, CP, or SOB.  PAST MEDICAL HISTORY (PMH):  Past Medical History:  Diagnosis Date  . Arthritis   . Cancer [facility])    left breast cancer  . Hypertension   . Pneumonia   . Stroke [facility])    2 strokes, right side weakness    Reviewed. Otherwise negative.   PAST SURGICAL HISTORY ([place]):  Past Surgical History:  Procedure Laterality Date  . ABDOMINAL HYSTERECTOMY    . FOOT SURGERY Right   . SIMPLE MASTECTOMY WITH AXILLARY SENTINEL NODE BIOPSY Left 12/13/2015   Procedure: LEFT SIMPLE MASTECTOMY WITH AXILLARY SENTINEL NODE BIOPSY;  Surgeon: Erroll Luna, MD;  Location: [facility];  Service: General;  Laterality: Left;  . TUBAL LIGATION      Reviewed. Otherwise negative.   MEDICATIONS:  Prior to Admission medications   Medication Sig Start Date End Date Taking? Authorizing Provider  amLODipine (NORVASC) 10 MG tablet Take 1 tablet by mouth daily. 08/07/15   Historical Provider, MD  aspirin 81 MG EC tablet Take 1 tablet by mouth daily.    Historical Provider, MD  atenolol (TENORMIN) 50 MG tablet Take 1 tablet by mouth daily. 08/21/15   Historical Provider, MD  baclofen (LIORESAL) 10 MG tablet Take 1 tablet by mouth 3 (three) times daily as needed. 08/07/15   Historical Provider, MD  benazepril (LOTENSIN) 20 MG tablet Take 20 mg by mouth daily.  08/05/15   Historical Provider, MD  chlorthalidone (HYGROTON) 25 MG tablet Take 1 tablet by mouth daily. 08/20/15   Historical Provider, MD  clopidogrel (PLAVIX) 75 MG tablet  Take 1 tablet by mouth daily. 08/05/15   Historical Provider, MD  CYCLOPHOSPHAMIDE IV Inject into the vein. Every 21 days    Historical Provider, MD  dexamethasone (DECADRON) 4 MG tablet Take 2 tablets (8 mg total) by mouth daily. Start the day after chemotherapy for 2 days. Take with food. 01/16/16   Patrici Ranks, MD  fluorouracil (ADRUCIL) 2.5 GM/50ML SOLN Inject into the vein once. Injection every 21 days    Historical Provider, MD  lidocaine-prilocaine (EMLA) cream Apply to affected area once 01/16/16   Patrici Ranks, MD  LORazepam (ATIVAN) 0.5 MG tablet Take 1 tablet by mouth daily as needed for anxiety.  10/31/15   Historical Provider, MD  methotrexate (PF) in sodium chloride 0.9 % 500 mL Inject into the vein once. Every 21 days    Historical Provider, MD  ondansetron (ZOFRAN) 8 MG tablet Take 1 tablet (8 mg total) by mouth 2 (two) times daily as needed for refractory nausea / vomiting. Start on day 3 after chemotherapy. 01/16/16   Patrici Ranks, MD  ondansetron (ZOFRAN-ODT) 4 MG disintegrating tablet Take 1 tablet (4 mg total) by mouth every 6 (six) hours as needed for nausea. 12/14/15   Erroll Luna, MD  oxyCODONE (OXY IR/ROXICODONE) 5 MG immediate release tablet Take 1-2 tablets (5-10 mg total) by mouth every 4 (four) hours as needed for moderate pain. 12/14/15   Erroll Luna, MD  prochlorperazine (COMPAZINE) 10 MG tablet Take 1 tablet (10 mg total) by mouth every 6 (six)  hours as needed (Nausea or vomiting). 01/16/16   Patrici Ranks, MD     ALLERGIES:  Allergies  Allergen Reactions  . No Known Allergies Other (See Comments)     SOCIAL HISTORY:  Social History   Social History  . Marital status: Widowed    Spouse name: N/A  . Number of children: N/A  . Years of education: N/A   Occupational History  . Not on file.   Social History Main Topics  . Smoking status: Current Every Day Smoker    Packs/day: 0.25    Years: 20.00    Types: Cigarettes  . Smokeless  tobacco: Never Used  . Alcohol use No  . Drug use: No  . Sexual activity: Not on file   Other Topics Concern  . Not on file   Social History Narrative  . No narrative on file    The patient currently resides (home / rehab facility / nursing home): Home  The patient normally is (ambulatory / bedbound) : Ambulatory   FAMILY HISTORY:  Family History  Problem Relation Age of Onset  . Hypertension Mother   . Diabetes Mother   . Other Father     sleeping disease    Otherwise negative.   REVIEW OF SYSTEMS:  Constitutional: denies any other weight loss, fever, chills, or sweats  Eyes: denies any other vision changes, history of eye injury  ENT: denies sore throat, hearing problems  Respiratory: denies shortness of breath, wheezing  Cardiovascular: denies chest pain, palpitations  Gastrointestinal: denies N/V, diarrhea  Musculoskeletal: denies any other joint pains or cramps  Skin: Denies any other rashes or skin discolorations  Neurological: denies any other headache, dizziness, weakness  Psychiatric: denies any other depression, anxiety   All other review of systems were otherwise negative.  VITAL SIGNS:  Vital Signs as of 123456:  Systolic Q000111Q: Diastolic 0000000: Heart Rate 54: Temp 36.56C (Temporal) Height 166CM: Weight 91.63KG  BMI 33.1 kg/m2   INTAKE/OUTPUT:  This shift: No intake/output data recorded.  Last 2 shifts: @IOLAST2SHIFTS @  PHYSICAL EXAM:  Constitutional:  -- Normal body habitus  -- Awake, alert, and oriented x3  Eyes:  -- Pupils equally round and reactive to light  -- No scleral icterus  Ear, nose, throat:  -- No jugular venous distension  Pulmonary:  -- No crackles  -- Equal breath sounds bilaterally -- Breathing non-labored at rest Cardiovascular:  -- S1, S2 present  -- No pericardial rubs  Gastrointestinal:  -- Abdomen soft, nontender, nondistended, no guarding/rebound  -- No abdominal masses appreciated, pulsatile or otherwise   Musculoskeletal / Integumentary:  -- Wounds or skin discoloration: None except well-healed Left mastectomy and Left axillary surgical incisions -- Extremities: B/L UE and LE FROM, hands and feet warm, no edema  Neurologic:  -- Motor function: Intact and symmetric -- Sensation: Intact and symmetric  Labs:  CBC:  Lab Results  Component Value Date   WBC 7.7 12/13/2015   RBC 4.99 12/13/2015   BMP:  Lab Results  Component Value Date   GLUCOSE 102 (H) 12/06/2015   CO2 22 12/06/2015   BUN 11 12/06/2015   CREATININE 0.97 12/13/2015   CALCIUM 9.4 12/06/2015     Assessment/Plan: (ICD-10's: C50.912) 60 y.o. female with Left breast invasive ductal carcinoma requiring durable central venous catheter access with chronic indwelling subcutaenous port, complicated by pertinent comorbidities including stroke, HTN, osteoarthritis, and ongoing tobacco abuse.    - will plan for insertion of tunneled central venous catheter with subcutaneous  port Friday, 9/31 per patient's request  - all risks, benefits, and alternatives to insertion of tunneled central venous catheter with subcutaneous port discussed with patient, and informed consent was obtained   - okay to continue routine daily aspirin and Plavix for thromboembolic stroke prophylaxis  - surgical follow-up 2 weeks following planned procedure  All of the above findings and recommendations were discussed with the patient, and all of her questions were answered to her expressed satisfaction.  -- Marilynne Drivers Rosana Hoes, MD, [place]: [facility] Office: [phone]

## 2016-01-29 NOTE — Patient Instructions (Signed)
Julie Parker  01/29/2016     @PREFPERIOPPHARMACY @   Your procedure is scheduled on 02/01/2016.  Report to [facility] at 7:30 A.M.  Call this number if you have problems the morning of surgery:  [phone]   Remember:  Do not eat food or drink liquids after midnight.  Take these medicines the morning of surgery with A SIP OF WATER Amlodipine, Atenolol, Lotensin, Decadron, Ativan, Zofran if needed  Oxy IR if needed   Do not wear jewelry, make-up or nail polish.  Do not wear lotions, powders, or perfumes, or deoderant.  Do not shave 48 hours prior to surgery.  Men may shave face and neck.  Do not bring valuables to the hospital.  [facility] is not responsible for any belongings or valuables.  Contacts, dentures or bridgework may not be worn into surgery.  Leave your suitcase in the car.  After surgery it may be brought to your room.  For patients admitted to the hospital, discharge time will be determined by your treatment team.  Patients discharged the day of surgery will not be allowed to drive home.    Please read over the following fact sheets that you were given. Surgical Site Infection Prevention and Anesthesia Post-op Instructions     PATIENT INSTRUCTIONS POST-ANESTHESIA  IMMEDIATELY FOLLOWING SURGERY:  Do not drive or operate machinery for the first twenty four hours after surgery.  Do not make any important decisions for twenty four hours after surgery or while taking narcotic pain medications or sedatives.  If you develop intractable nausea and vomiting or a severe headache please notify your doctor immediately.  FOLLOW-UP:  Please make an appointment with your surgeon as instructed. You do not need to follow up with anesthesia unless specifically instructed to do so.  WOUND CARE INSTRUCTIONS (if applicable):  Keep a dry clean dressing on the anesthesia/puncture wound site if there is drainage.  Once the wound has quit draining you may leave it open to air.   Generally you should leave the bandage intact for twenty four hours unless there is drainage.  If the epidural site drains for more than 36-48 hours please call the anesthesia department.  QUESTIONS?:  Please feel free to call your physician or the hospital operator if you have any questions, and they will be happy to assist you.      Implanted Port Insertion An implanted port is a central line that has a round shape and is placed under the skin. It is used as a long-term IV access for:   Medicines, such as chemotherapy.   Fluids.   Liquid nutrition, such as total parenteral nutrition (TPN).   Blood samples.  LET [facility] CARE PROVIDER KNOW ABOUT:  Allergies to food or medicine.   Medicines taken, including vitamins, herbs, eye drops, creams, and over-the-counter medicines.   Any allergies to heparin.  Use of steroids (by mouth or creams).   Previous problems with anesthetics or numbing medicines.   History of bleeding problems or blood clots.   Previous surgery.   Other health problems, including diabetes and kidney problems.   Possibility of pregnancy, if this applies. RISKS AND COMPLICATIONS Generally, this is a safe procedure. However, as with any procedure, problems can occur. Possible problems include:  Damage to the blood vessel, bruising, or bleeding at the puncture site.   Infection.  Blood clot in the vessel that the port is in.  Breakdown of the skin over your port.  Very rarely a  person may develop a condition called a pneumothorax, a collection of air in the chest that may cause one of the lungs to collapse. The placement of these catheters with the appropriate imaging guidance significantly decreases the risk of a pneumothorax.  BEFORE THE PROCEDURE   Your health care provider may want you to have blood tests. These tests can help tell how well your kidneys and liver are working. They can also show how well your blood clots.   If you  take blood thinners (anticoagulant medicines), ask your health care provider when you should stop taking them.   Make arrangements for someone to drive you home. This is necessary if you have been sedated for your procedure.  PROCEDURE  Port insertion usually takes about 30-45 minutes.   An IV needle will be inserted in your arm. Medicine for pain and medicine to help relax you (sedative) will flow directly into your body through this needle.   You will lie on an exam table, and you will be connected to monitors to keep track of your heart rate, blood pressure, and breathing throughout the procedure.  An oxygen monitoring device may be attached to your finger. Oxygen will be given.   Everything will be kept as germ free (sterile) as possible during the procedure. The skin near the point of the incision will be cleansed with antiseptic, and the area will be draped with sterile towels. The skin and deeper tissues over the port area will be made numb with a local anesthetic.  Two small cuts (incisions) will be made in the skin to insert the port. One will be made in the neck to obtain access to the vein where the catheter will lie.   Because the port reservoir will be placed under the skin, a small skin incision will be made in the upper chest, and a small pocket for the port will be made under the skin. The catheter that will be connected to the port tunnels to a large central vein in the chest. A small, raised area will remain on your body at the site of the reservoir when the procedure is complete.  The port placement will be done under imaging guidance to ensure the proper placement.  The reservoir has a silicone covering that can be punctured with a special needle.   The port will be flushed with normal saline, and blood will be drawn to make sure it is working properly.  There will be nothing remaining outside the skin when the procedure is finished.   Incisions will be held  together by stitches, surgical glue, or a special tape. AFTER THE PROCEDURE  You will stay in a recovery area until the anesthesia has worn off. Your blood pressure and pulse will be checked.  A final chest X-ray will be taken to check the placement of the port and to ensure that there is no injury to your lung.   This information is not intended to replace advice given to you by your health care provider. Make sure you discuss any questions you have with your health care provider.   Document Released: 02/09/2013 Document Revised: 05/12/2014 Document Reviewed: 02/09/2013 Elsevier Interactive Patient Education [employer].

## 2016-01-30 ENCOUNTER — Encounter (HOSPITAL_COMMUNITY): Payer: Self-pay

## 2016-01-30 ENCOUNTER — Ambulatory Visit (HOSPITAL_COMMUNITY)
Admission: RE | Admit: 2016-01-30 | Discharge: 2016-01-30 | Disposition: A | Discharge status: Home / Self Care | Payer: Medicare HMO | Source: Ambulatory Visit | Attending: Surgery | Admitting: Surgery

## 2016-01-30 ENCOUNTER — Encounter (HOSPITAL_COMMUNITY)
Admission: RE | Admit: 2016-01-30 | Discharge: 2016-01-30 | Disposition: A | Discharge status: Home / Self Care | Payer: Medicare HMO | Source: Ambulatory Visit | Attending: Surgery | Admitting: Surgery

## 2016-01-30 DIAGNOSIS — C50919 Malignant neoplasm of unspecified site of unspecified female breast: Secondary | ICD-10-CM | POA: Diagnosis not present

## 2016-01-30 DIAGNOSIS — Z01811 Encounter for preprocedural respiratory examination: Secondary | ICD-10-CM | POA: Diagnosis not present

## 2016-01-30 DIAGNOSIS — Z01810 Encounter for preprocedural cardiovascular examination: Secondary | ICD-10-CM | POA: Insufficient documentation

## 2016-01-30 DIAGNOSIS — I700 Atherosclerosis of aorta: Secondary | ICD-10-CM | POA: Diagnosis not present

## 2016-01-31 ENCOUNTER — Other Ambulatory Visit (HOSPITAL_COMMUNITY): Payer: Self-pay | Admitting: Surgery

## 2016-01-31 DIAGNOSIS — C801 Malignant (primary) neoplasm, unspecified: Secondary | ICD-10-CM

## 2016-02-01 ENCOUNTER — Encounter (HOSPITAL_COMMUNITY): Admission: RE | Payer: Self-pay | Source: Ambulatory Visit

## 2016-02-01 ENCOUNTER — Ambulatory Visit (HOSPITAL_COMMUNITY): Payer: Medicare HMO

## 2016-02-01 ENCOUNTER — Ambulatory Visit (HOSPITAL_COMMUNITY): Admission: RE | Admit: 2016-02-01 | Payer: Medicare HMO | Source: Ambulatory Visit | Admitting: Surgery

## 2016-02-01 ENCOUNTER — Encounter (HOSPITAL_COMMUNITY): Payer: Medicare HMO

## 2016-02-01 DIAGNOSIS — C50912 Malignant neoplasm of unspecified site of left female breast: Secondary | ICD-10-CM

## 2016-02-01 SURGERY — INSERTION, TUNNELED CENTRAL VENOUS DEVICE, WITH PORT
Anesthesia: Monitor Anesthesia Care | Laterality: Right

## 2016-02-01 MED ORDER — SODIUM CHLORIDE 0.9 % IV SOLN: 600.0000 mg/m2 | Freq: Once | INTRAVENOUS | Status: DC

## 2016-02-01 MED ORDER — PALONOSETRON HCL INJECTION 0.25 MG/5ML
0.2500 mg | Freq: Once | INTRAVENOUS | Status: DC
Start: 2016-02-01 — End: 2016-02-01

## 2016-02-01 MED ORDER — SODIUM CHLORIDE 0.9 % IV SOLN: Freq: Once | INTRAVENOUS | Status: DC

## 2016-02-01 MED ORDER — METHOTREXATE SODIUM (PF) CHEMO INJECTION 250 MG/10ML: 40.0000 mg/m2 | Freq: Once | INTRAMUSCULAR | Status: DC

## 2016-02-01 MED ORDER — PEGFILGRASTIM 6 MG/0.6ML ~~LOC~~ PSKT: 6.0000 mg | PREFILLED_SYRINGE | Freq: Once | SUBCUTANEOUS | Status: DC

## 2016-02-01 MED ORDER — SODIUM CHLORIDE 0.9% FLUSH: 10.0000 mL | INTRAVENOUS | Status: DC | PRN

## 2016-02-01 MED ORDER — FLUOROURACIL CHEMO INJECTION 2.5 GM/50ML: 600.0000 mg/m2 | Freq: Once | INTRAVENOUS | Status: DC

## 2016-02-01 MED ORDER — SODIUM CHLORIDE 0.9 % IV SOLN
10.0000 mg | Freq: Once | INTRAVENOUS | Status: DC
  Filled 2016-02-01: qty 1

## 2016-02-01 MED ORDER — HEPARIN SOD (PORK) LOCK FLUSH 100 UNIT/ML IV SOLN: 500.0000 [IU] | Freq: Once | INTRAVENOUS | Status: DC | PRN

## 2016-02-01 NOTE — Progress Notes (Signed)
Patient arrived without having gone to have her port placed this AM.  Patient educated on the importance of the [place]. Message sent to [facility] per Dr.  Muse request that [facility] call the patient to ask if she is still interested in treatment and that compliance is very important for her optimal outcome with her treatment.

## 2016-02-04 ENCOUNTER — Telehealth (HOSPITAL_COMMUNITY): Payer: Self-pay | Admitting: Emergency Medicine

## 2016-02-04 NOTE — Telephone Encounter (Signed)
Called pt to talk to her about the importance of keeping all her appt.  Asked her if she wanted to be treated, she said yes.  I told her that if we started treatment that it was very important that we stay on track and not miss appt bc cancer was smart and the treatments could stop working.  She verbalized understanding.  We will speak with the surgeon and try to get her set back up for the port placement then back on the schedule for chemotherapy.  We will call her with all this information.

## 2016-02-06 ENCOUNTER — Ambulatory Visit (HOSPITAL_COMMUNITY): Payer: Medicare HMO | Admitting: Hematology & Oncology

## 2016-02-06 ENCOUNTER — Telehealth (HOSPITAL_COMMUNITY): Payer: Self-pay | Admitting: Emergency Medicine

## 2016-02-06 NOTE — Telephone Encounter (Signed)
[place] notified that her port would be placed on 10/11 by Dr Rosana Hoes.  She would need someone to come with her on the [facility].  They will not take her home after she has had anesthesia by herself.  They will pick her up on 10/11 at 6:30 am that morning.  I told the pt I would call her the day before to remind her. Pt verbalized understanding.

## 2016-02-06 NOTE — Telephone Encounter (Signed)
Let message for pt to call me back

## 2016-02-06 NOTE — Telephone Encounter (Signed)
Called pt to let her know that OR changed her time of the surgery to 10/12 at 9:00 am.  [facility] will still pick her up at 6:30 am and she will still need someone with her. Pt verbalized understanding.

## 2016-02-07 ENCOUNTER — Telehealth (HOSPITAL_COMMUNITY): Payer: Self-pay | Admitting: Emergency Medicine

## 2016-02-07 NOTE — Telephone Encounter (Signed)
Called to let pt know that her first chemotherapy would be 02/22/2016 at 9:10 am.  She verbalized understanding.  I told her she needed to call and make transportation arrangements through [facility].

## 2016-02-11 ENCOUNTER — Encounter (HOSPITAL_COMMUNITY)
Admission: RE | Admit: 2016-02-11 | Discharge: 2016-02-11 | Disposition: A | Discharge status: Home / Self Care | Payer: Medicare HMO | Source: Ambulatory Visit | Attending: Surgery | Admitting: Surgery

## 2016-02-12 ENCOUNTER — Other Ambulatory Visit (HOSPITAL_COMMUNITY): Payer: Medicare HMO

## 2016-02-12 ENCOUNTER — Ambulatory Visit (HOSPITAL_COMMUNITY): Payer: Medicare HMO | Admitting: Hematology & Oncology

## 2016-02-12 ENCOUNTER — Encounter (HOSPITAL_COMMUNITY): Payer: Self-pay

## 2016-02-13 ENCOUNTER — Telehealth (HOSPITAL_COMMUNITY): Payer: Self-pay | Admitting: Emergency Medicine

## 2016-02-13 NOTE — Telephone Encounter (Signed)
Called to remind pt that [facility] would pick her up in the morning around 630am and her port-a-cath would be placed at [facility] about 9am.  She would need someone to ride home with her.  She verbalized understanding.

## 2016-02-14 ENCOUNTER — Encounter (HOSPITAL_COMMUNITY)
Admission: RE | Disposition: A | Discharge status: Home / Self Care | Payer: Self-pay | Source: Ambulatory Visit | Attending: Surgery

## 2016-02-14 ENCOUNTER — Encounter (HOSPITAL_COMMUNITY): Payer: Self-pay

## 2016-02-14 ENCOUNTER — Ambulatory Visit (HOSPITAL_COMMUNITY): Payer: Medicare HMO

## 2016-02-14 ENCOUNTER — Ambulatory Visit (HOSPITAL_COMMUNITY)
Admission: RE | Admit: 2016-02-14 | Discharge: 2016-02-14 | Disposition: A | Discharge status: Home / Self Care | Payer: Medicare HMO | Source: Ambulatory Visit | Attending: Surgery | Admitting: Surgery

## 2016-02-14 ENCOUNTER — Ambulatory Visit (HOSPITAL_COMMUNITY): Payer: Medicare HMO | Admitting: Anesthesiology

## 2016-02-14 DIAGNOSIS — C801 Malignant (primary) neoplasm, unspecified: Secondary | ICD-10-CM

## 2016-02-14 DIAGNOSIS — F17210 Nicotine dependence, cigarettes, uncomplicated: Secondary | ICD-10-CM | POA: Diagnosis not present

## 2016-02-14 DIAGNOSIS — M1990 Unspecified osteoarthritis, unspecified site: Secondary | ICD-10-CM | POA: Insufficient documentation

## 2016-02-14 DIAGNOSIS — Z90710 Acquired absence of both cervix and uterus: Secondary | ICD-10-CM | POA: Insufficient documentation

## 2016-02-14 DIAGNOSIS — I69351 Hemiplegia and hemiparesis following cerebral infarction affecting right dominant side: Secondary | ICD-10-CM | POA: Insufficient documentation

## 2016-02-14 DIAGNOSIS — Z789 Other specified health status: Secondary | ICD-10-CM

## 2016-02-14 DIAGNOSIS — C50912 Malignant neoplasm of unspecified site of left female breast: Secondary | ICD-10-CM | POA: Insufficient documentation

## 2016-02-14 DIAGNOSIS — I10 Essential (primary) hypertension: Secondary | ICD-10-CM | POA: Insufficient documentation

## 2016-02-14 HISTORY — PX: PORTACATH PLACEMENT: SHX2246

## 2016-02-14 SURGERY — INSERTION, TUNNELED CENTRAL VENOUS DEVICE, WITH PORT
Anesthesia: Monitor Anesthesia Care | Site: Chest | Laterality: Right

## 2016-02-14 MED ORDER — CEFAZOLIN SODIUM-DEXTROSE 2-4 GM/100ML-% IV SOLN
INTRAVENOUS | Status: AC
  Filled 2016-02-14: qty 100

## 2016-02-14 MED ORDER — LIDOCAINE HCL (PF) 1 % IJ SOLN
INTRAMUSCULAR | Status: AC
  Filled 2016-02-14: qty 5

## 2016-02-14 MED ORDER — FENTANYL CITRATE (PF) 100 MCG/2ML IJ SOLN
25.0000 ug | INTRAMUSCULAR | Status: AC | PRN
  Administered 2016-02-14 (×2): 25 ug via INTRAVENOUS
  Filled 2016-02-14: qty 2

## 2016-02-14 MED ORDER — LIDOCAINE-EPINEPHRINE (PF) 1 %-1:200000 IJ SOLN
INTRAMUSCULAR | Status: AC
  Filled 2016-02-14: qty 30

## 2016-02-14 MED ORDER — SODIUM CHLORIDE 0.9 % IV SOLN
INTRAVENOUS | Status: DC | PRN
  Administered 2016-02-14: 50 mL

## 2016-02-14 MED ORDER — HEPARIN SOD (PORK) LOCK FLUSH 100 UNIT/ML IV SOLN
INTRAVENOUS | Status: AC
  Filled 2016-02-14: qty 5

## 2016-02-14 MED ORDER — LIDOCAINE HCL (PF) 1 % IJ SOLN
INTRAMUSCULAR | Status: AC
  Filled 2016-02-14: qty 30

## 2016-02-14 MED ORDER — HEPARIN SOD (PORK) LOCK FLUSH 100 UNIT/ML IV SOLN
INTRAVENOUS | Status: DC | PRN
  Administered 2016-02-14: 500 [IU] via INTRAVENOUS

## 2016-02-14 MED ORDER — MIDAZOLAM HCL 5 MG/5ML IJ SOLN
INTRAMUSCULAR | Status: DC | PRN
  Administered 2016-02-14 (×2): 1 mg via INTRAVENOUS

## 2016-02-14 MED ORDER — BUPIVACAINE HCL (PF) 0.5 % IJ SOLN
INTRAMUSCULAR | Status: AC
  Filled 2016-02-14: qty 30

## 2016-02-14 MED ORDER — LIDOCAINE HCL 1 % IJ SOLN
INTRAMUSCULAR | Status: DC | PRN
  Administered 2016-02-14: 12 mL via INTRAMUSCULAR

## 2016-02-14 MED ORDER — PROPOFOL 10 MG/ML IV BOLUS
INTRAVENOUS | Status: AC
  Filled 2016-02-14: qty 20

## 2016-02-14 MED ORDER — MIDAZOLAM HCL 2 MG/2ML IJ SOLN
1.0000 mg | INTRAMUSCULAR | Status: DC | PRN
Start: 2016-02-14 — End: 2016-02-14
  Administered 2016-02-14: 2 mg via INTRAVENOUS
  Filled 2016-02-14: qty 2

## 2016-02-14 MED ORDER — GLYCOPYRROLATE 0.2 MG/ML IJ SOLN
INTRAMUSCULAR | Status: DC | PRN
  Administered 2016-02-14: 0.2 mg via INTRAVENOUS

## 2016-02-14 MED ORDER — MIDAZOLAM HCL 2 MG/2ML IJ SOLN
INTRAMUSCULAR | Status: AC
  Filled 2016-02-14: qty 2

## 2016-02-14 MED ORDER — PROPOFOL 500 MG/50ML IV EMUL
INTRAVENOUS | Status: DC | PRN
  Administered 2016-02-14: 15 ug/kg/min via INTRAVENOUS

## 2016-02-14 MED ORDER — CEFAZOLIN SODIUM-DEXTROSE 2-4 GM/100ML-% IV SOLN
2.0000 g | INTRAVENOUS | Status: AC
  Administered 2016-02-14: 2 g via INTRAVENOUS

## 2016-02-14 MED ORDER — GLYCOPYRROLATE 0.2 MG/ML IJ SOLN
INTRAMUSCULAR | Status: AC
  Filled 2016-02-14: qty 1

## 2016-02-14 MED ORDER — LACTATED RINGERS IV SOLN
INTRAVENOUS | Status: DC
  Administered 2016-02-14: 08:00:00 via INTRAVENOUS

## 2016-02-14 MED ORDER — HYDROMORPHONE HCL 1 MG/ML IJ SOLN: 0.2500 mg | INTRAMUSCULAR | Status: DC | PRN

## 2016-02-14 MED ORDER — CHLORHEXIDINE GLUCONATE CLOTH 2 % EX PADS: 6.0000 | MEDICATED_PAD | Freq: Once | CUTANEOUS | Status: DC

## 2016-02-14 SURGICAL SUPPLY — 37 items
ADH SKN CLS APL DERMABOND .7 (GAUZE/BANDAGES/DRESSINGS) ×1
APPLIER CLIP 9.375 SM OPEN (CLIP)
APR CLP SM 9.3 20 MLT OPN (CLIP)
BAG DECANTER FOR FLEXI CONT (MISCELLANEOUS) ×3 IMPLANT
BAG HAMPER (MISCELLANEOUS) ×3 IMPLANT
BLADE SURG SZ11 CARB STEEL (BLADE) ×3 IMPLANT
CHLORAPREP W/TINT 10.5 ML (MISCELLANEOUS) ×6 IMPLANT
CLIP APPLIE 9.375 SM OPEN (CLIP) IMPLANT
CLOTH BEACON ORANGE TIMEOUT ST (SAFETY) ×3 IMPLANT
COVER LIGHT HANDLE STERIS (MISCELLANEOUS) ×6 IMPLANT
DECANTER SPIKE VIAL GLASS SM (MISCELLANEOUS) ×6 IMPLANT
DERMABOND ADVANCED (GAUZE/BANDAGES/DRESSINGS) ×2
DERMABOND ADVANCED .7 DNX12 (GAUZE/BANDAGES/DRESSINGS) ×1 IMPLANT
DRAPE C-ARM FOLDED MOBILE STRL (DRAPES) ×3 IMPLANT
ELECT REM PT RETURN 9FT ADLT (ELECTROSURGICAL) ×3
ELECTRODE REM PT RTRN 9FT ADLT (ELECTROSURGICAL) ×1 IMPLANT
GLOVE BIOGEL PI IND STRL 7.0 (GLOVE) ×1 IMPLANT
GLOVE BIOGEL PI IND STRL 7.5 (GLOVE) ×1 IMPLANT
GLOVE BIOGEL PI INDICATOR 7.0 (GLOVE) ×2
GLOVE BIOGEL PI INDICATOR 7.5 (GLOVE) ×2
GLOVE ECLIPSE 7.0 STRL STRAW (GLOVE) ×3 IMPLANT
GOWN STRL REUS W/TWL LRG LVL3 (GOWN DISPOSABLE) ×6 IMPLANT
IV NS 500ML (IV SOLUTION) ×3
IV NS 500ML BAXH (IV SOLUTION) ×1 IMPLANT
KIT PORT POWER 8FR ISP MRI (Port) ×3 IMPLANT
KIT ROOM TURNOVER APOR (KITS) ×3 IMPLANT
MANIFOLD NEPTUNE II (INSTRUMENTS) ×3 IMPLANT
NDL HYPO 25X1 1.5 SAFETY (NEEDLE) ×1 IMPLANT
NEEDLE HYPO 25X1 1.5 SAFETY (NEEDLE) ×3 IMPLANT
PACK MINOR (CUSTOM PROCEDURE TRAY) ×3 IMPLANT
PAD ARMBOARD 7.5X6 YLW CONV (MISCELLANEOUS) ×3 IMPLANT
SET BASIN LINEN APH (SET/KITS/TRAYS/PACK) ×3 IMPLANT
SUT VIC AB 3-0 SH 27 (SUTURE) ×3
SUT VIC AB 3-0 SH 27X BRD (SUTURE) ×1 IMPLANT
SUT VIC AB 4-0 PS2 27 (SUTURE) ×3 IMPLANT
SYR 20CC LL (SYRINGE) ×3 IMPLANT
SYR CONTROL 10ML LL (SYRINGE) ×3 IMPLANT

## 2016-02-14 NOTE — H&P (View-Only) (Signed)
SURGICAL HISTORY & PHYSICAL  HISTORY OF PRESENT ILLNESS (HPI):  60 y.o. female presented upon referral from medical oncology for placement of a tunneled central venous catheter with subcutaneous port for chemotherapy. Patient was diagnosed with invasive ductal carcinoma of the Left breast, underwent Left mastectomy, and pathology testing revealed tumor subtype that would benefit from systemic chemotherapy and Tamoxifen. Patient denies any unilateral upper extremity swelling, fever/chills, CP, or SOB.  PAST MEDICAL HISTORY (PMH):  Past Medical History:  Diagnosis Date  . Arthritis   . Cancer [facility])    left breast cancer  . Hypertension   . Pneumonia   . Stroke [facility])    2 strokes, right side weakness    Reviewed. Otherwise negative.   PAST SURGICAL HISTORY ([place]):  Past Surgical History:  Procedure Laterality Date  . ABDOMINAL HYSTERECTOMY    . FOOT SURGERY Right   . SIMPLE MASTECTOMY WITH AXILLARY SENTINEL NODE BIOPSY Left 12/13/2015   Procedure: LEFT SIMPLE MASTECTOMY WITH AXILLARY SENTINEL NODE BIOPSY;  Surgeon: Erroll Luna, MD;  Location: [facility];  Service: General;  Laterality: Left;  . TUBAL LIGATION      Reviewed. Otherwise negative.   MEDICATIONS:  Prior to Admission medications   Medication Sig Start Date End Date Taking? Authorizing Provider  amLODipine (NORVASC) 10 MG tablet Take 1 tablet by mouth daily. 08/07/15   Historical Provider, MD  aspirin 81 MG EC tablet Take 1 tablet by mouth daily.    Historical Provider, MD  atenolol (TENORMIN) 50 MG tablet Take 1 tablet by mouth daily. 08/21/15   Historical Provider, MD  baclofen (LIORESAL) 10 MG tablet Take 1 tablet by mouth 3 (three) times daily as needed. 08/07/15   Historical Provider, MD  benazepril (LOTENSIN) 20 MG tablet Take 20 mg by mouth daily.  08/05/15   Historical Provider, MD  chlorthalidone (HYGROTON) 25 MG tablet Take 1 tablet by mouth daily. 08/20/15   Historical Provider, MD  clopidogrel (PLAVIX) 75 MG tablet  Take 1 tablet by mouth daily. 08/05/15   Historical Provider, MD  CYCLOPHOSPHAMIDE IV Inject into the vein. Every 21 days    Historical Provider, MD  dexamethasone (DECADRON) 4 MG tablet Take 2 tablets (8 mg total) by mouth daily. Start the day after chemotherapy for 2 days. Take with food. 01/16/16   Patrici Ranks, MD  fluorouracil (ADRUCIL) 2.5 GM/50ML SOLN Inject into the vein once. Injection every 21 days    Historical Provider, MD  lidocaine-prilocaine (EMLA) cream Apply to affected area once 01/16/16   Patrici Ranks, MD  LORazepam (ATIVAN) 0.5 MG tablet Take 1 tablet by mouth daily as needed for anxiety.  10/31/15   Historical Provider, MD  methotrexate (PF) in sodium chloride 0.9 % 500 mL Inject into the vein once. Every 21 days    Historical Provider, MD  ondansetron (ZOFRAN) 8 MG tablet Take 1 tablet (8 mg total) by mouth 2 (two) times daily as needed for refractory nausea / vomiting. Start on day 3 after chemotherapy. 01/16/16   Patrici Ranks, MD  ondansetron (ZOFRAN-ODT) 4 MG disintegrating tablet Take 1 tablet (4 mg total) by mouth every 6 (six) hours as needed for nausea. 12/14/15   Erroll Luna, MD  oxyCODONE (OXY IR/ROXICODONE) 5 MG immediate release tablet Take 1-2 tablets (5-10 mg total) by mouth every 4 (four) hours as needed for moderate pain. 12/14/15   Erroll Luna, MD  prochlorperazine (COMPAZINE) 10 MG tablet Take 1 tablet (10 mg total) by mouth every 6 (six)  hours as needed (Nausea or vomiting). 01/16/16   Patrici Ranks, MD     ALLERGIES:  Allergies  Allergen Reactions  . No Known Allergies Other (See Comments)     SOCIAL HISTORY:  Social History   Social History  . Marital status: Widowed    Spouse name: N/A  . Number of children: N/A  . Years of education: N/A   Occupational History  . Not on file.   Social History Main Topics  . Smoking status: Current Every Day Smoker    Packs/day: 0.25    Years: 20.00    Types: Cigarettes  . Smokeless  tobacco: Never Used  . Alcohol use No  . Drug use: No  . Sexual activity: Not on file   Other Topics Concern  . Not on file   Social History Narrative  . No narrative on file    The patient currently resides (home / rehab facility / nursing home): Home  The patient normally is (ambulatory / bedbound) : Ambulatory   FAMILY HISTORY:  Family History  Problem Relation Age of Onset  . Hypertension Mother   . Diabetes Mother   . Other Father     sleeping disease    Otherwise negative.   REVIEW OF SYSTEMS:  Constitutional: denies any other weight loss, fever, chills, or sweats  Eyes: denies any other vision changes, history of eye injury  ENT: denies sore throat, hearing problems  Respiratory: denies shortness of breath, wheezing  Cardiovascular: denies chest pain, palpitations  Gastrointestinal: denies N/V, diarrhea  Musculoskeletal: denies any other joint pains or cramps  Skin: Denies any other rashes or skin discolorations  Neurological: denies any other headache, dizziness, weakness  Psychiatric: denies any other depression, anxiety   All other review of systems were otherwise negative.  VITAL SIGNS:  Vital Signs as of 123456:  Systolic Q000111Q: Diastolic 0000000: Heart Rate 54: Temp 36.56C (Temporal) Height 166CM: Weight 91.63KG  BMI 33.1 kg/m2   INTAKE/OUTPUT:  This shift: No intake/output data recorded.  Last 2 shifts: @IOLAST2SHIFTS @  PHYSICAL EXAM:  Constitutional:  -- Normal body habitus  -- Awake, alert, and oriented x3  Eyes:  -- Pupils equally round and reactive to light  -- No scleral icterus  Ear, nose, throat:  -- No jugular venous distension  Pulmonary:  -- No crackles  -- Equal breath sounds bilaterally -- Breathing non-labored at rest Cardiovascular:  -- S1, S2 present  -- No pericardial rubs  Gastrointestinal:  -- Abdomen soft, nontender, nondistended, no guarding/rebound  -- No abdominal masses appreciated, pulsatile or otherwise   Musculoskeletal / Integumentary:  -- Wounds or skin discoloration: None except well-healed Left mastectomy and Left axillary surgical incisions -- Extremities: B/L UE and LE FROM, hands and feet warm, no edema  Neurologic:  -- Motor function: Intact and symmetric -- Sensation: Intact and symmetric  Labs:  CBC:  Lab Results  Component Value Date   WBC 7.7 12/13/2015   RBC 4.99 12/13/2015   BMP:  Lab Results  Component Value Date   GLUCOSE 102 (H) 12/06/2015   CO2 22 12/06/2015   BUN 11 12/06/2015   CREATININE 0.97 12/13/2015   CALCIUM 9.4 12/06/2015     Assessment/Plan: (ICD-10's: C50.912) 60 y.o. female with Left breast invasive ductal carcinoma requiring durable central venous catheter access with chronic indwelling subcutaenous port, complicated by pertinent comorbidities including stroke, HTN, osteoarthritis, and ongoing tobacco abuse.    - will plan for insertion of tunneled central venous catheter with subcutaneous  port Friday, 9/31 per patient's request  - all risks, benefits, and alternatives to insertion of tunneled central venous catheter with subcutaneous port discussed with patient, and informed consent was obtained   - okay to continue routine daily aspirin and Plavix for thromboembolic stroke prophylaxis  - surgical follow-up 2 weeks following planned procedure  All of the above findings and recommendations were discussed with the patient, and all of her questions were answered to her expressed satisfaction.  -- Marilynne Drivers Rosana Hoes, MD, [place]: [facility] Office: [phone]

## 2016-02-14 NOTE — Anesthesia Procedure Notes (Signed)
Procedure Name: MAC Date/Time: 02/14/2016 9:12 AM Performed by: Andree Elk, AMY A Pre-anesthesia Checklist: Timeout performed, Patient identified, Emergency Drugs available, Suction available and Patient being monitored Oxygen Delivery Method: Simple face mask

## 2016-02-14 NOTE — Discharge Instructions (Addendum)
In addition to included general post-operative instructions for Insertion of Central Venous Catheter with Subcutaneous Port for Chemotherapy,  Diet: Resume home heart healthy diet.   Activity: No heavy lifting (children, pets, laundry) or strenuous activity until follow-up, but light activity and walking are encouraged. Do not drive or drink alcohol if taking narcotic pain medications.   Wound care: 2 days after surgery (Saturday, 10/14), may shower/get incision wet with soapy water and pat dry (do not rub incisions), but no baths or submerging incision underwater until follow-up.   Medications: Resume all home medications. For mild to moderate pain: acetaminophen (Tylenol) or ibuprofen (if no kidney disease). Narcotic pain medications, if prescribed, can be used for severe pain, though may cause nausea, constipation, and drowsiness. Do not combine Tylenol and Percocet within a 6 hour period as Percocet contains Tylenol. If you do not need the narcotic pain medication, you do not need to fill the prescription.  Call office [phone]) at any time if any questions, worsening pain, fevers/chills, bleeding, drainage from incision site, or other concerns.   Implanted Port Insertion, Care After Refer to this sheet in the next few weeks. These instructions provide you with information on caring for yourself after your procedure. Your health care provider may also give you more specific instructions. Your treatment has been planned according to current medical practices, but problems sometimes occur. Call your health care provider if you have any problems or questions after your procedure. WHAT TO EXPECT AFTER THE PROCEDURE After your procedure, it is typical to have the following:   Discomfort at the port insertion site. Ice packs to the area will help.  Bruising on the skin over the port. This will subside in 3-4 days. HOME CARE INSTRUCTIONS  After your port is placed, you will get a  manufacturer's information card. The card has information about your port. Keep this card with you at all times.   Know what kind of port you have. There are many types of ports available.   Wear a medical alert bracelet in case of an emergency. This can help alert health care workers that you have a port.   The port can stay in for as long as your health care provider believes it is necessary.   A home health care nurse may give medicines and take care of the port.   You or a family member can get special training and directions for giving medicine and taking care of the port at home.  SEEK MEDICAL CARE IF:   Your port does not flush or you are unable to get a blood return.   You have a fever or chills. SEEK IMMEDIATE MEDICAL CARE IF:  You have new fluid or pus coming from your incision.   You notice a bad smell coming from your incision site.   You have swelling, pain, or more redness at the incision or port site.   You have chest pain or shortness of breath.   This information is not intended to replace advice given to you by your health care provider. Make sure you discuss any questions you have with your health care provider.   Document Released: 02/09/2013 Document Revised: 04/26/2013 Document Reviewed: 02/09/2013 Elsevier Interactive Patient Education 2016 [employer]   PATIENT INSTRUCTIONS POST-ANESTHESIA  IMMEDIATELY FOLLOWING SURGERY:  Do not drive or operate machinery for the first twenty four hours after surgery.  Do not make any important decisions for twenty four hours after surgery or while taking narcotic pain medications  or sedatives.  If you develop intractable nausea and vomiting or a severe headache please notify your doctor immediately.  FOLLOW-UP:  Please make an appointment with your surgeon as instructed. You do not need to follow up with anesthesia unless specifically instructed to do so.  WOUND CARE INSTRUCTIONS (if applicable):  Keep a  dry clean dressing on the anesthesia/puncture wound site if there is drainage.  Once the wound has quit draining you may leave it open to air.  Generally you should leave the bandage intact for twenty four hours unless there is drainage.  If the epidural site drains for more than 36-48 hours please call the anesthesia department.  QUESTIONS?:  Please feel free to call your physician or the hospital operator if you have any questions, and they will be happy to assist you.

## 2016-02-14 NOTE — Transfer of Care (Signed)
Immediate Anesthesia Transfer of Care Note  Patient: Julie Parker  Procedure(s) Performed: Procedure(s): INSERTION PORT-A-CATH (Right)  Patient Location: PACU  Anesthesia Type:MAC  Level of Consciousness: awake, alert , oriented and patient cooperative  Airway & Oxygen Therapy: Patient Spontanous Breathing and Patient connected to nasal cannula oxygen  Post-op Assessment: Report given to RN and Post -op Vital signs reviewed and stable  Post vital signs: Reviewed and stable  Last Vitals:  Vitals:   02/14/16 0830 02/14/16 0835  BP: (!) 156/83 (!) 153/80  Pulse:    Resp: (!) 35 20  Temp:      Last Pain:  Vitals:   02/14/16 0814  TempSrc: Oral      Patients Stated Pain Goal: 4 ([id number])  Complications: No apparent anesthesia complications

## 2016-02-14 NOTE — Interval H&P Note (Signed)
History and Physical Interval Note:  02/14/2016 8:55 AM  Julie Parker  has presented today for surgery, with the diagnosis of left breast cancer  The various methods of treatment have been discussed with the patient and family. After consideration of risks, benefits and other options for treatment, the patient has consented to  Procedure(s): INSERTION PORT-A-CATH (Right) as a surgical intervention .  The patient's history has been reviewed, patient examined, no change in status, stable for surgery.  I have reviewed the patient's chart and labs.  Questions were answered to the patient's satisfaction.     Vickie Epley

## 2016-02-14 NOTE — Anesthesia Preprocedure Evaluation (Signed)
Anesthesia Evaluation  Patient identified by MRN, date of birth, ID band Patient awake    Reviewed: Allergy & Precautions, NPO status , Patient's Chart, lab work & pertinent test results, reviewed documented beta blocker date and time   Airway Mallampati: III  TM Distance: >3 FB Neck ROM: Full    Dental  (+) Dental Advisory Given, Teeth Intact   Pulmonary asthma , COPD (no diagnosis, but suspected), Current Smoker,   Patient states this is her baseline breathing and that she always has wheezes. She does not feel short of breath. Will administer albuterol nebulizer preop.   + wheezing      Cardiovascular Exercise Tolerance: Poor hypertension, Pt. on medications and Pt. on home beta blockers (-) angina+ CABG   Rhythm:Regular Rate:Normal     Neuro/Psych CVA, Residual Symptoms    GI/Hepatic negative GI ROS, Neg liver ROS,   Endo/Other  negative endocrine ROS  Renal/GU negative Renal ROS     Musculoskeletal  (+) Arthritis ,   Abdominal (+) + obese,   Peds  Hematology  (+) Blood dyscrasia (on Plavix (last dose 8/4)), ,   Anesthesia Other Findings   Reproductive/Obstetrics                             Anesthesia Physical Anesthesia Plan  ASA: III  Anesthesia Plan: MAC   Post-op Pain Management:    Induction: Intravenous  Airway Management Planned: Simple Face Mask  Additional Equipment:   Intra-op Plan:   Post-operative Plan:   Informed Consent: I have reviewed the patients History and Physical, chart, labs and discussed the procedure including the risks, benefits and alternatives for the proposed anesthesia with the patient or authorized representative who has indicated his/her understanding and acceptance.     Plan Discussed with:   Anesthesia Plan Comments:         Anesthesia Quick Evaluation

## 2016-02-14 NOTE — Anesthesia Postprocedure Evaluation (Signed)
Anesthesia Post Note  Patient: Julie Parker  Procedure(s) Performed: Procedure(s) (LRB): INSERTION PORT-A-CATH (Right)  Patient location during evaluation: PACU Anesthesia Type: MAC Level of consciousness: awake and alert and oriented Pain management: pain level controlled Vital Signs Assessment: post-procedure vital signs reviewed and stable Respiratory status: spontaneous breathing Cardiovascular status: blood pressure returned to baseline Postop Assessment: no signs of nausea or vomiting Anesthetic complications: no    Last Vitals:  Vitals:   02/14/16 0830 02/14/16 0835  BP: (!) 156/83 (!) 153/80  Pulse:    Resp: (!) 35 20  Temp:      Last Pain:  Vitals:   02/14/16 0814  TempSrc: Oral                 Tige Meas A

## 2016-02-14 NOTE — Op Note (Signed)
SURGICAL PROCEDURE REPORT  DATE OF PROCEDURE: 02/14/2016   ATTENDING SURGEON: Corene Cornea E. Rosana Hoes, MD   ANESTHESIA: Local with light IV sedation   PRE-OPERATIVE DIAGNOSIS: Advanced Left breast carcinoma requiring durable central venous access for chemotherapy (ICD-10: C50.912)  POST-OPERATIVE DIAGNOSIS: Advanced Left breast carcinoma requiring durable central venous access for chemotherapy (ICD-10: C50.912)  PROCEDURE(S): (cpt: [id number]) 1.) Percutaneous access of Right internal jugular vein under ultrasound guidance  2.) Insertion of tunneled Right internal jugular Bard PowerPort central venous catheter with subcutaneous port  INTRAOPERATIVE FINDINGS: Patent easily compressible Right internal jugular vein with appropriate respiratory variations and well-secured tunneled central venous catheter with subcutaneous port at completion of the procedure  INTRAOPERATIVE FLUIDS: 400 mL crystalloid, 0 mL contrast used   FLUOROSCOPY: 1 second  ESTIMATED BLOOD LOSS: Minimal (<20 mL)   SPECIMENS: None   IMPLANTS: 60F tunneled Bard PowerPort central venous catheter with subcutaneous port  DRAINS: None   COMPLICATIONS: None apparent   CONDITION AT COMPLETION: Hemodynamically stable, awake   DISPOSITION: PACU   INDICATION(S) FOR PROCEDURE:  Patient is a 60 y.o. female who presented with advanced Left breast cancer requiring durable central venous access for chemotherapy. All risks, benefits, and alternatives to above elective procedures were discussed with the patient, who elected to proceed, and informed consent was accordingly obtained at that time.  DETAILS OF PROCEDURE:  Patient was brought to the operative suite and appropriately identified. In Trendelenburg position, Right IJ venous access site was prepped and draped in the usual sterile fashion, and following a brief timeout, limited duplex evaluation of the Right internal jugular vein was performed. Percutaneous Right IJ venous access was  obtained under ultrasound guidance using Seldinger technique, by which local anesthetic was injected over the Right IJ vein, and access needle was inserted under direct ultrasound visualization into the Right IJ vein, through which soft guidewire was advanced, over which access needle was withdrawn. Guidewire was secured, attention was directed to injection of local anesthetic along the planned tunnel site, 2-3 cm transverse Right chest incision was made and confirmed to accommodate the subcutaneous port, and flushed catheter was tunneled retrograde from the port site over the Right chest to the Right IJ access site with the attached port well-secured to the catheter and within the subcutaneous pocket. Insertion sheath was advanced over the guidewire, which was withdrawn along with the insertion sheath dilator. Length of catheter needed to position the catheter tip at the atrio-caval junction was then measured under direct fluoroscopic visualization, after which the catheter was cut to the measured length and advanced through the sheath into the Right internal jugular vein and SVC without evidence of cardiac arrhythmias during the procedure. Port was confirmed to withdraw blood and flush easily, after which concentrated heparin was instilled into the port and catheter. Dermis at the subcutaneous pocket was re-approximated using buried interrupted 3-0 Vicryl suture, and 4-0 Vicryl suture was used to re-approximate skin at the insertion and subcutaneous port sites in running subcuticular fashion for the subcutaneous port and buried interrupted fashion for the insertion site. Skin was cleaned, dried, and sterile skin glue was applied. Patient was then safely transferred to PACU for a chest x-ray.  I was present for all aspects of the procedures, and there were no intraprocedural complications apparent.

## 2016-02-19 ENCOUNTER — Encounter (HOSPITAL_COMMUNITY): Payer: Self-pay | Admitting: Surgery

## 2016-02-21 ENCOUNTER — Other Ambulatory Visit (HOSPITAL_COMMUNITY): Payer: Self-pay | Admitting: Hematology & Oncology

## 2016-02-22 ENCOUNTER — Encounter (HOSPITAL_BASED_OUTPATIENT_CLINIC_OR_DEPARTMENT_OTHER): Payer: Medicare HMO | Admitting: Oncology

## 2016-02-22 ENCOUNTER — Encounter (HOSPITAL_COMMUNITY): Payer: Medicare HMO | Attending: Hematology & Oncology

## 2016-02-22 ENCOUNTER — Encounter (HOSPITAL_COMMUNITY): Payer: Self-pay | Admitting: Oncology

## 2016-02-22 VITALS — BP 212/98 | HR 59 | Temp 97.7°F | Resp 18 | Wt 204.0 lb

## 2016-02-22 VITALS — BP 129/73 | HR 57 | Temp 97.9°F | Resp 16

## 2016-02-22 DIAGNOSIS — C50912 Malignant neoplasm of unspecified site of left female breast: Secondary | ICD-10-CM

## 2016-02-22 DIAGNOSIS — C50412 Malignant neoplasm of upper-outer quadrant of left female breast: Secondary | ICD-10-CM | POA: Diagnosis not present

## 2016-02-22 DIAGNOSIS — C773 Secondary and unspecified malignant neoplasm of axilla and upper limb lymph nodes: Secondary | ICD-10-CM

## 2016-02-22 DIAGNOSIS — Z5111 Encounter for antineoplastic chemotherapy: Secondary | ICD-10-CM | POA: Diagnosis not present

## 2016-02-22 DIAGNOSIS — I10 Essential (primary) hypertension: Secondary | ICD-10-CM | POA: Diagnosis not present

## 2016-02-22 LAB — CBC WITH DIFFERENTIAL/PLATELET
BASOS ABS: 0 10*3/uL (ref 0.0–0.1)
BASOS PCT: 0 %
Eosinophils Absolute: 0.2 10*3/uL (ref 0.0–0.7)
Eosinophils Relative: 3 %
HEMATOCRIT: 38.7 % (ref 36.0–46.0)
HEMOGLOBIN: 14 g/dL (ref 12.0–15.0)
Lymphocytes Relative: 41 %
Lymphs Abs: 2.9 10*3/uL (ref 0.7–4.0)
MCH: 30.7 pg (ref 26.0–34.0)
MCHC: 36.2 g/dL — ABNORMAL HIGH (ref 30.0–36.0)
MCV: 84.9 fL (ref 78.0–100.0)
Monocytes Absolute: 0.5 10*3/uL (ref 0.1–1.0)
Monocytes Relative: 7 %
NEUTROS ABS: 3.4 10*3/uL (ref 1.7–7.7)
NEUTROS PCT: 49 %
Platelets: 259 10*3/uL (ref 150–400)
RBC: 4.56 MIL/uL (ref 3.87–5.11)
RDW: 13.8 % (ref 11.5–15.5)
WBC: 7.1 10*3/uL (ref 4.0–10.5)

## 2016-02-22 LAB — COMPREHENSIVE METABOLIC PANEL
ALBUMIN: 3.8 g/dL (ref 3.5–5.0)
ALT: 20 U/L (ref 14–54)
AST: 18 U/L (ref 15–41)
Alkaline Phosphatase: 98 U/L (ref 38–126)
Anion gap: 6 (ref 5–15)
BILIRUBIN TOTAL: 0.6 mg/dL (ref 0.3–1.2)
BUN: 12 mg/dL (ref 6–20)
CALCIUM: 9.2 mg/dL (ref 8.9–10.3)
CO2: 27 mmol/L (ref 22–32)
Chloride: 106 mmol/L (ref 101–111)
Creatinine, Ser: 0.84 mg/dL (ref 0.44–1.00)
GFR calc Af Amer: >60 mL/min (ref >60)
GFR calc non Af Amer: >60 mL/min (ref >60)
GLUCOSE: 122 mg/dL — AB (ref 65–99)
Potassium: 3.2 mmol/L — ABNORMAL LOW (ref 3.5–5.1)
Sodium: 139 mmol/L (ref 135–145)
TOTAL PROTEIN: 7.4 g/dL (ref 6.5–8.1)

## 2016-02-22 MED ORDER — SODIUM CHLORIDE 0.9 % IV SOLN
Freq: Once | INTRAVENOUS | Status: AC
  Administered 2016-02-22: 12:00:00 via INTRAVENOUS

## 2016-02-22 MED ORDER — HEPARIN SOD (PORK) LOCK FLUSH 100 UNIT/ML IV SOLN
500.0000 [IU] | Freq: Once | INTRAVENOUS | Status: AC | PRN
  Administered 2016-02-22: 500 [IU]
  Filled 2016-02-22: qty 5

## 2016-02-22 MED ORDER — SODIUM CHLORIDE 0.9 % IV SOLN
600.0000 mg/m2 | Freq: Once | INTRAVENOUS | Status: AC
  Administered 2016-02-22: 1240 mg via INTRAVENOUS
  Filled 2016-02-22: qty 50

## 2016-02-22 MED ORDER — METHOTREXATE SODIUM (PF) CHEMO INJECTION 250 MG/10ML
40.0000 mg/m2 | Freq: Once | INTRAMUSCULAR | Status: AC
  Administered 2016-02-22: 82 mg via INTRAVENOUS
  Filled 2016-02-22: qty 3.28

## 2016-02-22 MED ORDER — SODIUM CHLORIDE 0.9% FLUSH: 10.0000 mL | INTRAVENOUS | Status: DC | PRN

## 2016-02-22 MED ORDER — DEXAMETHASONE SODIUM PHOSPHATE 10 MG/ML IJ SOLN
10.0000 mg | Freq: Once | INTRAMUSCULAR | Status: AC
  Administered 2016-02-22: 10 mg via INTRAVENOUS

## 2016-02-22 MED ORDER — PALONOSETRON HCL INJECTION 0.25 MG/5ML
0.2500 mg | Freq: Once | INTRAVENOUS | Status: AC
  Administered 2016-02-22: 0.25 mg via INTRAVENOUS

## 2016-02-22 MED ORDER — DEXAMETHASONE SODIUM PHOSPHATE 10 MG/ML IJ SOLN
INTRAMUSCULAR | Status: AC
  Filled 2016-02-22: qty 1

## 2016-02-22 MED ORDER — CLONIDINE HCL 0.2 MG PO TABS
0.2000 mg | ORAL_TABLET | Freq: Once | ORAL | Status: AC
  Administered 2016-02-22: 0.2 mg via ORAL
  Filled 2016-02-22: qty 1

## 2016-02-22 MED ORDER — PALONOSETRON HCL INJECTION 0.25 MG/5ML
INTRAVENOUS | Status: AC
  Filled 2016-02-22: qty 5

## 2016-02-22 MED ORDER — FLUOROURACIL CHEMO INJECTION 2.5 GM/50ML
600.0000 mg/m2 | Freq: Once | INTRAVENOUS | Status: AC
  Administered 2016-02-22: 1250 mg via INTRAVENOUS
  Filled 2016-02-22: qty 25

## 2016-02-22 MED ORDER — SODIUM CHLORIDE 0.9 % IV SOLN: 10.0000 mg | Freq: Once | INTRAVENOUS | Status: DC

## 2016-02-22 NOTE — Patient Instructions (Addendum)
[place] at [facility] Discharge Instructions  RECOMMENDATIONS MADE BY THE CONSULTANT AND ANY TEST RESULTS WILL BE SENT TO YOUR REFERRING PHYSICIAN.  You saw Kirby Crigler, PA-C, today. Return on Monday for neulasta injection. You had clonidine 0.2 mg for your high BP. Follow up in 10 days with labs- already scheduled.  Thank you for choosing [place] at [facility] to provide your oncology and hematology care.  To afford each patient quality time with our provider, please arrive at least 15 minutes before your scheduled appointment time.   Beginning January 23rd 2017 lab work for the [facility] will be done in the  Main lab at [facility] on 1st floor. If you have a lab appointment with the [facility] please come in thru the  Main Entrance and check in at the main information desk  You need to re-schedule your appointment should you arrive 10 or more minutes late.  We strive to give you quality time with our providers, and arriving late affects you and other patients whose appointments are after yours.  Also, if you no show three or more times for appointments you may be dismissed from the clinic at the providers discretion.     Again, [facility]  Our hope is that these requests will decrease the amount of time that you wait before being seen by our physicians.       _____________________________________________________________  Should you have questions after your visit to [facility], please contact our office at [phone] between the hours of 8:30 a.m. and 4:30 p.m.  Voicemails left after 4:30 p.m. will not be returned until the following business day.  For prescription refill requests, have your pharmacy contact our office.         Resources For Cancer Patients and their Caregivers ? American Cancer Society: Can assist with transportation, wigs, general needs, runs Look  Good Feel Better.        419-539-2252 ? Cancer Care: Provides financial assistance, online support groups, medication/co-pay assistance.  1-800-813-HOPE (206)189-1364) ? [facility] Assists [place] cancer patients and their families through emotional , educational and financial support.  404-048-4783 ? [place] DSS Where to apply for food stamps, Medicaid and utility assistance. (715)119-5810 ? RCATS: Transportation to medical appointments. 8628301626 ? Social Security Administration: May apply for disability if have a Stage IV cancer. 754-155-5165 973-091-1216 ? [employer], Disability and Transit Services: Assists with nutrition, care and transit needs. [facility] Support Programs: @10RELATIVEDAYS @ > Cancer Support Group  2nd Tuesday of the month 1pm-2pm, Journey Room  > Creative Journey  3rd Tuesday of the month 1130am-1pm, Journey Room  > Look Good Feel Better  1st Wednesday of the month 10am-12 noon, Journey Room (Call [facility] to register [phone])

## 2016-02-22 NOTE — Progress Notes (Signed)
1345:  Tolerated tx w/o adverse reaction. Alert, in no distress.  VSS.  Discharged via wheelchair.

## 2016-02-22 NOTE — Progress Notes (Signed)
We to see pt during first chemo treatment.  Pt seemed to be doing very well.  Pt had no questions or concerns at this time.

## 2016-02-22 NOTE — Patient Instructions (Signed)
[facility] Discharge Instructions for Patients Receiving Chemotherapy   Beginning January 23rd 2017 lab work for the [facility] will be done in the  Main lab at [facility] on 1st floor. If you have a lab appointment with the [facility] please come in thru the  Main Entrance and check in at the main information desk   Today you received the following chemotherapy agents:  Cytoxan, methotrexate, and 5FU  To help prevent nausea and vomiting after your treatment, we encourage you to take your nausea medication as prescribed.  If you develop nausea and vomiting, or diarrhea that is not controlled by your medication, call the clinic.  The clinic phone number is [phone]. Office hours are Monday-Friday 8:30am-5:00pm.  BELOW ARE SYMPTOMS THAT SHOULD BE REPORTED IMMEDIATELY:  *FEVER GREATER THAN 101.0 F  *CHILLS WITH OR WITHOUT FEVER  NAUSEA AND VOMITING THAT IS NOT CONTROLLED WITH YOUR NAUSEA MEDICATION  *UNUSUAL SHORTNESS OF BREATH  *UNUSUAL BRUISING OR BLEEDING  TENDERNESS IN MOUTH AND THROAT WITH OR WITHOUT PRESENCE OF ULCERS  *URINARY PROBLEMS  *BOWEL PROBLEMS  UNUSUAL RASH Items with * indicate a potential emergency and should be followed up as soon as possible. If you have an emergency after office hours please contact your primary care physician or go to the nearest emergency department.  Please call the clinic during office hours if you have any questions or concerns.   You may also contact the Patient Navigator at [phone] should you have any questions or need assistance in obtaining follow up care.      Resources For Cancer Patients and their Caregivers ? American Cancer Society: Can assist with transportation, wigs, general needs, runs Look Good Feel Better.        339-359-0449 ? Cancer Care: Provides financial assistance, online support groups, medication/co-pay assistance.  1-800-813-HOPE 786-277-1741) ? [facility] Assists [place] cancer patients and their families through emotional , educational and financial support.  312-788-3760 ? [place] DSS Where to apply for food stamps, Medicaid and utility assistance. (213) 105-8293 ? RCATS: Transportation to medical appointments. 640-205-4569 ? Social Security Administration: May apply for disability if have a Stage IV cancer. ([phone] 903-837-6705 ? [employer], Disability and Transit Services: Assists with nutrition, care and transit needs. ([phone] [phone]

## 2016-02-22 NOTE — Assessment & Plan Note (Addendum)
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  Now on adjuvant therapy with CMF.  Oncology history is updated.  Pre-treatment labs today: CBC diff, CMET.  I personally reviewed and went over laboratory results with the patient.  The results are noted within this dictation.  Hypertension is noted today.  Order is placed for 0.2 mg of Clonidine.    Nurse expresses concern regarding OnPro and the patient's ability to understand this.  As a result, I will change her OnPro to Neulasta injection on Monday to avoid any issues related to GSF support.  Return in 7-10 days for NADIR check with labs: CBC diff, CMET.

## 2016-02-22 NOTE — Progress Notes (Signed)
Pcp Not In System No address on file  Cancer of left breast, stage 2 ([place]) - Plan: CBC with Differential, Comprehensive metabolic panel  CURRENT THERAPY: Starting CMF today, 02/22/2016  INTERVAL HISTORY: Julie Parker 60 y.o. female returns for followup of Stage IIB (T2(2)N1M0) invasive ductal adenocarcinoma of left breat with multifocal disease, ER/PR+, HER2 NEGATIVE, and 1/1 sentinel lymph node positive for disease.  Now on adjuvant therapy with CMF.   Oncology History   Multifocal L breast cancer     Cancer of left breast, stage 2 ([place])   07/04/2015 Mammogram    Digital mammography reveals 3 abnormal areas in the left breast, 2:00 position, 1.6 x 1.4 cm palpable mass, 10:00 position 1.7 x 1.4 cm palpable mass, upper outer quadrant 1.5-1.1 pleomorphic calcifications        07/04/2015 Initial Biopsy    Biopsies reveals invasive ductal carcinoma at the 2:00 position in the 10:00 position, ER positive greater than 90%, PR +1-5%, HER-2 negative Ki-67 marker 26%, the second tumor greater than 90% ER positive, 80% PR positive, HER-2 negative, Ki-67 marker 24%          08/17/2015 Imaging    MRI breast: 1.4 cm area of nodular and linear enhancement in the upper inner quadrant of the LEFT breast suspicious for ductal carcinoma in situ and possible invasive ductal carcinoma. There are no findings suspicious for malignancy in the right breast. 2.1 cm biopsy-proven invasive ductal carcinoma in the 10 o'clock position of the left breast. 1.7 cm biopsy-proven invasive ductal carcinoma in the 2 o'clock position of the left breast       12/13/2015 Initial Diagnosis    Breast cancer, left ([place])     12/13/2015 Surgery    Multifocal L breast cancer, L simple mastectomy with L sentinel LN mapping      12/18/2015 Pathology Results    Invasive ductal adenocarcinoma, grade 3, spanning 2.6 cm and 1.9 cm. Ductal carcinoma in situ, grade 3. Lymphovascular invasion identified. Margins of  resection were negative for carcinoma. 1/1 metastatic ductal adenocarcinoma in left axillary sentinel lymph node biopsy.      12/18/2015 Pathology Results    ER 100% positive, PR 100% positive, KI-67 15%, HER-2 negative.      02/22/2016 -  Chemotherapy    The patient had palonosetron (ALOXI) injection 0.25 mg, 0.25 mg, Intravenous,  Once, 1 of 6 cycles  methotrexate (PF) chemo injection 82 mg, 40 mg/m2 = 82 mg, Intravenous,  Once, 1 of 6 cycles  pegfilgrastim (NEULASTA ONPRO KIT) injection 6 mg, 6 mg, Subcutaneous, Once, 1 of 6 cycles  cyclophosphamide (CYTOXAN) 1,240 mg in sodium chloride 0.9 % 250 mL chemo infusion, 600 mg/m2 = 1,240 mg, Intravenous,  Once, 1 of 6 cycles  fluorouracil (ADRUCIL) chemo injection 1,250 mg, 600 mg/m2 = 1,250 mg, Intravenous,  Once, 1 of 6 cycles  for chemotherapy treatment.        She is doing well. She notes that her port was placed without any complications.  She denies any headaches or dizziness. Her blood pressure is noted the significantly elevated today. She notes that she did not take her blood pressure medicine this morning. She usually takes at around noon time. She denies any chest pain or diaphoresis. No neurological complaints either.  She denies any questions regarding treatment today. She notes that she will write down any questions in present them to [place] in the future if needed.  Review of Systems  Constitutional: Negative.  Negative for chills, fever and weight loss.  HENT: Negative.   Eyes: Negative.  Negative for blurred vision and double vision.  Respiratory: Negative.  Negative for cough.   Cardiovascular: Negative.  Negative for chest pain.  Gastrointestinal: Negative.  Negative for constipation, diarrhea, nausea and vomiting.  Genitourinary: Negative.   Musculoskeletal: Negative.   Skin: Negative.   Neurological: Negative.  Negative for dizziness, tingling, sensory change, speech change, focal weakness, loss of consciousness,  weakness and headaches.  Endo/Heme/Allergies: Negative.   Psychiatric/Behavioral: Negative.     Past Medical History:  Diagnosis Date  . Arthritis   . Cancer [facility])    left breast cancer  . Hypertension   . Pneumonia   . Stroke [facility])    2 strokes, right side weakness    Past Surgical History:  Procedure Laterality Date  . ABDOMINAL HYSTERECTOMY    . FOOT SURGERY Right   . PORTACATH PLACEMENT Right 02/14/2016   Procedure: INSERTION PORT-A-CATH;  Surgeon: Vickie Epley, MD;  Location: [facility];  Service: Vascular;  Laterality: Right;  . SIMPLE MASTECTOMY WITH AXILLARY SENTINEL NODE BIOPSY Left 12/13/2015   Procedure: LEFT SIMPLE MASTECTOMY WITH AXILLARY SENTINEL NODE BIOPSY;  Surgeon: Erroll Luna, MD;  Location: [place];  Service: General;  Laterality: Left;  . TUBAL LIGATION      Family History  Problem Relation Age of Onset  . Hypertension Mother   . Diabetes Mother   . Other Father     sleeping disease    Social History   Social History  . Marital status: Widowed    Spouse name: N/A  . Number of children: N/A  . Years of education: N/A   Social History Main Topics  . Smoking status: Current Every Day Smoker    Packs/day: 0.25    Years: 20.00    Types: Cigarettes  . Smokeless tobacco: Never Used  . Alcohol use No  . Drug use: No  . Sexual activity: Not Currently    Birth control/ protection: Surgical   Other Topics Concern  . None   Social History Narrative  . None     PHYSICAL EXAMINATION  ECOG PERFORMANCE STATUS: 1 - Symptomatic but completely ambulatory  Vitals:   02/22/16 0950  BP: (!) 212/98  Pulse: (!) 59  Resp: 18  Temp: 97.7 F (36.5 C)    GENERAL:alert, no distress, well nourished, well developed, comfortable, cooperative, obese, smiling and Unaccompanied, in chemotherapy recliner. SKIN: skin color, texture, turgor are normal, no rashes or significant lesions HEAD: Normocephalic, No masses, lesions, tenderness or  abnormalities EYES: normal, EOMI, Conjunctiva are pink and non-injected EARS: External ears normal OROPHARYNX:lips, buccal mucosa, and tongue normal and mucous membranes are moist  NECK: supple, trachea midline LYMPH:  not examined BREAST:not examined LUNGS: clear to auscultation  HEART: regular rate & rhythm ABDOMEN:abdomen soft and obese BACK: Back symmetric, no curvature. EXTREMITIES:less then 2 second capillary refill, no joint deformities, effusion, or inflammation, no skin discoloration, no cyanosis  NEURO: alert & oriented x 3 with fluent speech, no focal motor/sensory deficits   LABORATORY DATA: CBC    Component Value Date/Time   WBC 7.1 02/22/2016 0941   RBC 4.56 02/22/2016 0941   HGB 14.0 02/22/2016 0941   HCT 38.7 02/22/2016 0941   PLT 259 02/22/2016 0941   MCV 84.9 02/22/2016 0941   MCH 30.7 02/22/2016 0941   MCHC 36.2 (H) 02/22/2016 0941   RDW 13.8 02/22/2016 0941   LYMPHSABS 2.9 02/22/2016 0941  MONOABS 0.5 02/22/2016 0941   EOSABS 0.2 02/22/2016 0941   BASOSABS 0.0 02/22/2016 0941      Chemistry      Component Value Date/Time   NA 139 02/22/2016 0941   K 3.2 (L) 02/22/2016 0941   CL 106 02/22/2016 0941   CO2 27 02/22/2016 0941   BUN 12 02/22/2016 0941   CREATININE 0.84 02/22/2016 0941      Component Value Date/Time   CALCIUM 9.2 02/22/2016 0941   ALKPHOS 98 02/22/2016 0941   AST 18 02/22/2016 0941   ALT 20 02/22/2016 0941   BILITOT 0.6 02/22/2016 0941        PENDING LABS:   RADIOGRAPHIC STUDIES:  Chest 2 View  Result Date: 01/30/2016 CLINICAL DATA:  Preoperative examination. History of breast malignancy with mastectomy 1 month ago. History of previous CVA. EXAM: CHEST  2 VIEW COMPARISON:  Portable chest x-ray of June 14, 2009 FINDINGS: The lungs are well-expanded and clear. The heart and pulmonary vascularity are normal. The mediastinum is normal in width. There is calcification in the wall of the aortic arch. There is no pleural  effusion. There is mild dextrocurvature centered in the mid thoracic spine. There surgical clips in the left axillary region. The left breast shadow is absent. IMPRESSION: There is no acute cardiopulmonary abnormality. Aortic atherosclerosis. Electronically Signed   By: David  Martinique M.D.   On: 01/30/2016 15:52   US Guided Needle Placement  Result Date: 02/14/2016 CLINICAL DATA: cancer, cath placement Ultrasound was provided for use by the ordering physician, and a technical charge was applied by the performing facility.  No radiologist interpretation/professional services rendered.   [place] Intraoperative  Result Date: 02/14/2016 CLINICAL DATA: cancer, cath placement Ultrasound was provided for use by the ordering physician, and a technical charge was applied by the performing facility.  No radiologist interpretation/professional services rendered.   Dg Chest Port 1 View  Result Date: 02/14/2016 CLINICAL DATA:  Status post central venous catheter placement EXAM: PORTABLE CHEST 1 VIEW COMPARISON:  01/30/2016 FINDINGS: There is a right chest wall port a catheter with tip in the projection of the SVC. There is no pneumothorax identified after port placement. Normal heart size. No pleural effusion or edema identified. No airspace consolidation. IMPRESSION: 1. No complications following Port-A-Cath placement. Electronically Signed   By: Kerby Moors M.D.   On: 02/14/2016 10:54   Dg C-arm 1-60 Min-no Report  Result Date: 02/14/2016 CLINICAL DATA: port a cath insertion C-ARM 1-60 MINUTES Fluoroscopy was utilized by the requesting physician.  No radiographic interpretation.     PATHOLOGY:    ASSESSMENT AND PLAN:  Cancer of left breast, stage 2 (H[facility]) Stage IIB (T2(2)N1M0) invasive ductal adenocarcinoma of left breat with multifocal disease, ER/PR+, HER2 NEGATIVE, and 1/1 sentinel lymph node positive for disease.  Now on adjuvant therapy with CMF.  Oncology history is updated.  Pre-treatment  labs today: CBC diff, CMET.  I personally reviewed and went over laboratory results with the patient.  The results are noted within this dictation.  Hypertension is noted today.  Order is placed for 0.2 mg of Clonidine.    Nurse expresses concern regarding OnPro and the patient's ability to understand this.  As a result, I will change her OnPro to Neulasta injection on Monday to avoid any issues related to GSF support.  Return in 7-10 days for NADIR check with labs: CBC diff, CMET.    ORDERS PLACED FOR THIS ENCOUNTER: Orders Placed This Encounter  Procedures  . CBC  with Differential  . Comprehensive metabolic panel    MEDICATIONS PRESCRIBED THIS ENCOUNTER: No orders of the defined types were placed in this encounter.   THERAPY PLAN:  Start CMF as planned today.  All questions were answered. The patient knows to call the clinic with any problems, questions or concerns. We can certainly see the patient much sooner if necessary.  Patient and plan discussed with Dr. Ancil Linsey and she is in agreement with the aforementioned.   This note is electronically signed by: Doy Mince 02/22/2016 10:23 AM

## 2016-02-25 ENCOUNTER — Encounter (HOSPITAL_COMMUNITY): Payer: Self-pay

## 2016-02-25 ENCOUNTER — Encounter (HOSPITAL_BASED_OUTPATIENT_CLINIC_OR_DEPARTMENT_OTHER): Payer: Medicare HMO

## 2016-02-25 VITALS — BP 162/82 | HR 51 | Temp 98.7°F | Resp 18

## 2016-02-25 DIAGNOSIS — Z5189 Encounter for other specified aftercare: Secondary | ICD-10-CM | POA: Diagnosis not present

## 2016-02-25 DIAGNOSIS — C50912 Malignant neoplasm of unspecified site of left female breast: Secondary | ICD-10-CM

## 2016-02-25 DIAGNOSIS — C773 Secondary and unspecified malignant neoplasm of axilla and upper limb lymph nodes: Secondary | ICD-10-CM

## 2016-02-25 DIAGNOSIS — C50412 Malignant neoplasm of upper-outer quadrant of left female breast: Secondary | ICD-10-CM

## 2016-02-25 MED ORDER — PEGFILGRASTIM INJECTION 6 MG/0.6ML ~~LOC~~
6.0000 mg | PREFILLED_SYRINGE | Freq: Once | SUBCUTANEOUS | Status: AC
  Administered 2016-02-25: 6 mg via SUBCUTANEOUS

## 2016-02-25 NOTE — Progress Notes (Signed)
Julie Parker presents today for injection per the provider's orders.  Neulasta administration without incident; see MAR for injection details.  Patient tolerated procedure well and without incident.  No questions or complaints noted at this time.  Pt denies any s/s related to first chemotherapy tx on 02/22/16 - denies n/v/d, denies constipation, denies fatigue.

## 2016-02-25 NOTE — Patient Instructions (Signed)
[place] at [facility] Discharge Instructions  RECOMMENDATIONS MADE BY THE CONSULTANT AND ANY TEST RESULTS WILL BE SENT TO YOUR REFERRING PHYSICIAN.  Neulasta injection today. Return as scheduled for chemotherapy.   Thank you for choosing [place] at [facility] to provide your oncology and hematology care.  To afford each patient quality time with our provider, please arrive at least 15 minutes before your scheduled appointment time.   Beginning January 23rd 2017 lab work for the [facility] will be done in the  Main lab at [facility] on 1st floor. If you have a lab appointment with the [facility] please come in thru the  Main Entrance and check in at the main information desk  You need to re-schedule your appointment should you arrive 10 or more minutes late.  We strive to give you quality time with our providers, and arriving late affects you and other patients whose appointments are after yours.  Also, if you no show three or more times for appointments you may be dismissed from the clinic at the providers discretion.     Again, thank you for choosing [facility].  Our hope is that these requests will decrease the amount of time that you wait before being seen by our physicians.       _____________________________________________________________  Should you have questions after your visit to [facility], please contact our office at [phone] between the hours of 8:30 a.m. and 4:30 p.m.  Voicemails left after 4:30 p.m. will not be returned until the following business day.  For prescription refill requests, have your pharmacy contact our office.         Resources For Cancer Patients and their Caregivers ? American Cancer Society: Can assist with transportation, wigs, general needs, runs Look Good Feel Better.        7025475081 ? Cancer Care: Provides financial assistance, online support  groups, medication/co-pay assistance.  1-800-813-HOPE (334) 109-1370) ? [facility] Assists [place] cancer patients and their families through emotional , educational and financial support.  (512)682-8094 ? [place] DSS Where to apply for food stamps, Medicaid and utility assistance. 530-212-5099 ? RCATS: Transportation to medical appointments. 4707485101 ? Social Security Administration: May apply for disability if have a Stage IV cancer. 815-768-0769 760-033-7884 ? [employer], Disability and Transit Services: Assists with nutrition, care and transit needs. [place] Support Programs: @10RELATIVEDAYS @ > Cancer Support Group  2nd Tuesday of the month 1pm-2pm, Journey Room  > Creative Journey  3rd Tuesday of the month 1130am-1pm, Journey Room  > Look Good Feel Better  1st Wednesday of the month 10am-12 noon, Journey Room (Call [place] to register [phone])

## 2016-02-27 ENCOUNTER — Other Ambulatory Visit (HOSPITAL_COMMUNITY): Payer: Self-pay | Admitting: *Deleted

## 2016-02-27 MED ORDER — POTASSIUM CHLORIDE ER 10 MEQ PO TBCR: 10.0000 meq | EXTENDED_RELEASE_TABLET | Freq: Two times a day (BID) | ORAL | 3 refills | Status: DC

## 2016-03-03 ENCOUNTER — Encounter (HOSPITAL_COMMUNITY): Payer: Self-pay | Admitting: Oncology

## 2016-03-03 ENCOUNTER — Encounter (HOSPITAL_COMMUNITY): Payer: Medicare HMO

## 2016-03-03 ENCOUNTER — Encounter (HOSPITAL_BASED_OUTPATIENT_CLINIC_OR_DEPARTMENT_OTHER): Payer: Medicare HMO | Admitting: Oncology

## 2016-03-03 DIAGNOSIS — C50912 Malignant neoplasm of unspecified site of left female breast: Secondary | ICD-10-CM

## 2016-03-03 DIAGNOSIS — Z720 Tobacco use: Secondary | ICD-10-CM | POA: Diagnosis not present

## 2016-03-03 DIAGNOSIS — C50212 Malignant neoplasm of upper-inner quadrant of left female breast: Secondary | ICD-10-CM

## 2016-03-03 LAB — COMPREHENSIVE METABOLIC PANEL
ALK PHOS: 133 U/L — AB (ref 38–126)
ALT: 14 U/L (ref 14–54)
AST: 14 U/L — AB (ref 15–41)
Albumin: 3.7 g/dL (ref 3.5–5.0)
Anion gap: 6 (ref 5–15)
BUN: 10 mg/dL (ref 6–20)
CALCIUM: 8.8 mg/dL — AB (ref 8.9–10.3)
CO2: 27 mmol/L (ref 22–32)
CREATININE: 0.94 mg/dL (ref 0.44–1.00)
Chloride: 104 mmol/L (ref 101–111)
Glucose, Bld: 153 mg/dL — ABNORMAL HIGH (ref 65–99)
Potassium: 3 mmol/L — ABNORMAL LOW (ref 3.5–5.1)
Sodium: 137 mmol/L (ref 135–145)
Total Bilirubin: 0.6 mg/dL (ref 0.3–1.2)
Total Protein: 7.1 g/dL (ref 6.5–8.1)

## 2016-03-03 NOTE — Patient Instructions (Signed)
[place] at [facility] Discharge Instructions  RECOMMENDATIONS MADE BY THE CONSULTANT AND ANY TEST RESULTS WILL BE SENT TO YOUR REFERRING PHYSICIAN.  You were seen today by Kirby Crigler PA-C. Labs today, will call with results. Return as scheduled for treatment and follow up.   Thank you for choosing [place] at [facility] to provide your oncology and hematology care.  To afford each patient quality time with our provider, please arrive at least 15 minutes before your scheduled appointment time.   Beginning January 23rd 2017 lab work for the [facility] will be done in the  Main lab at [facility] on 1st floor. If you have a lab appointment with the [facility] please come in thru the  Main Entrance and check in at the main information desk  You need to re-schedule your appointment should you arrive 10 or more minutes late.  We strive to give you quality time with our providers, and arriving late affects you and other patients whose appointments are after yours.  Also, if you no show three or more times for appointments you may be dismissed from the clinic at the providers discretion.     Again, thank you for choosing [facility].  Our hope is that these requests will decrease the amount of time that you wait before being seen by our physicians.       _____________________________________________________________  Should you have questions after your visit to [facility], please contact our office at [phone] between the hours of 8:30 a.m. and 4:30 p.m.  Voicemails left after 4:30 p.m. will not be returned until the following business day.  For prescription refill requests, have your pharmacy contact our office.         Resources For Cancer Patients and their Caregivers ? American Cancer Society: Can assist with transportation, wigs, general needs, runs Look Good Feel Better.         [phone] ? Cancer Care: Provides financial assistance, online support groups, medication/co-pay assistance.  1-800-813-HOPE 250-759-9232) ? [place] Assists [place] cancer patients and their families through emotional , educational and financial support.  512-196-6324 ? [place] DSS Where to apply for food stamps, Medicaid and utility assistance. (630)673-9949 ? RCATS: Transportation to medical appointments. 581-538-0454 ? Social Security Administration: May apply for disability if have a Stage IV cancer. 914-256-4848 867-275-0759 ? [employer], Disability and Transit Services: Assists with nutrition, care and transit needs. [place] Support Programs: @10RELATIVEDAYS @ > Cancer Support Group  2nd Tuesday of the month 1pm-2pm, Journey Room  > Creative Journey  3rd Tuesday of the month 1130am-1pm, Journey Room  > Look Good Feel Better  1st Wednesday of the month 10am-12 noon, Journey Room (Call [place] to register [phone])

## 2016-03-03 NOTE — Progress Notes (Signed)
Pcp Not In System No address on file  Cancer of left breast, stage 2 [facility])  CURRENT THERAPY: Starting CMF today, 02/22/2016  INTERVAL HISTORY: Julie Parker 60 y.o. female returns for followup of Stage IIB (T2(2)N1M0) invasive ductal adenocarcinoma of left breat with multifocal disease, ER/PR+, HER2 NEGATIVE, and 1/1 sentinel lymph node positive for disease.  Now on adjuvant therapy with CMF.   Oncology History   Multifocal L breast cancer     Cancer of left breast, stage 2 ([place])   07/04/2015 Mammogram    Digital mammography reveals 3 abnormal areas in the left breast, 2:00 position, 1.6 x 1.4 cm palpable mass, 10:00 position 1.7 x 1.4 cm palpable mass, upper outer quadrant 1.5-1.1 pleomorphic calcifications        07/04/2015 Initial Biopsy    Biopsies reveals invasive ductal carcinoma at the 2:00 position in the 10:00 position, ER positive greater than 90%, PR +1-5%, HER-2 negative Ki-67 marker 26%, the second tumor greater than 90% ER positive, 80% PR positive, HER-2 negative, Ki-67 marker 24%          08/17/2015 Imaging    MRI breast: 1.4 cm area of nodular and linear enhancement in the upper inner quadrant of the LEFT breast suspicious for ductal carcinoma in situ and possible invasive ductal carcinoma. There are no findings suspicious for malignancy in the right breast. 2.1 cm biopsy-proven invasive ductal carcinoma in the 10 o'clock position of the left breast. 1.7 cm biopsy-proven invasive ductal carcinoma in the 2 o'clock position of the left breast       12/13/2015 Initial Diagnosis    Breast cancer, left ([place])     12/13/2015 Surgery    Multifocal L breast cancer, L simple mastectomy with L sentinel LN mapping      12/18/2015 Pathology Results    Invasive ductal adenocarcinoma, grade 3, spanning 2.6 cm and 1.9 cm. Ductal carcinoma in situ, grade 3. Lymphovascular invasion identified. Margins of resection were negative for carcinoma. 1/1 metastatic ductal  adenocarcinoma in left axillary sentinel lymph node biopsy.      12/18/2015 Pathology Results    ER 100% positive, PR 100% positive, KI-67 15%, HER-2 negative.      02/22/2016 -  Chemotherapy    The patient had palonosetron (ALOXI) injection 0.25 mg, 0.25 mg, Intravenous,  Once, 1 of 6 cycles  methotrexate (PF) chemo injection 82 mg, 40 mg/m2 = 82 mg, Intravenous,  Once, 1 of 6 cycles  pegfilgrastim (NEULASTA ONPRO KIT) injection 6 mg, 6 mg, Subcutaneous, Once, 1 of 6 cycles  cyclophosphamide (CYTOXAN) 1,240 mg in sodium chloride 0.9 % 250 mL chemo infusion, 600 mg/m2 = 1,240 mg, Intravenous,  Once, 1 of 6 cycles  fluorouracil (ADRUCIL) chemo injection 1,250 mg, 600 mg/m2 = 1,250 mg, Intravenous,  Once, 1 of 6 cycles  for chemotherapy treatment.        She tolerated her first treatment well without any nausea or vomiting.  Her appetite is strong and her weight is stable.  She denies any stomatitis or change in bowel habits.  She denies any diarrhea specifically.  She denies any signs/symptoms of palmar-plantar erythrodysesthesia.  She reports, "It went better than I expected."  Review of Systems  Constitutional: Negative.  Negative for chills, fever and weight loss.  HENT: Negative.   Eyes: Negative.  Negative for blurred vision and double vision.  Respiratory: Negative.  Negative for cough.   Cardiovascular: Negative.  Negative for chest pain.  Gastrointestinal: Negative.  Negative for constipation, diarrhea, nausea and vomiting.  Genitourinary: Negative.   Musculoskeletal: Negative.   Skin: Negative.   Neurological: Negative.  Negative for dizziness, tingling, sensory change, speech change, focal weakness, loss of consciousness, weakness and headaches.  Endo/Heme/Allergies: Negative.   Psychiatric/Behavioral: Negative.     Past Medical History:  Diagnosis Date  . Arthritis   . Cancer [facility])    left breast cancer  . Cancer of left breast, stage 2 ([place]) 12/13/2015  .  Hypertension   . Pneumonia   . Stroke [facility])    2 strokes, right side weakness    Past Surgical History:  Procedure Laterality Date  . ABDOMINAL HYSTERECTOMY    . FOOT SURGERY Right   . PORTACATH PLACEMENT Right 02/14/2016   Procedure: INSERTION PORT-A-CATH;  Surgeon: Vickie Epley, MD;  Location: [facility];  Service: Vascular;  Laterality: Right;  . SIMPLE MASTECTOMY WITH AXILLARY SENTINEL NODE BIOPSY Left 12/13/2015   Procedure: LEFT SIMPLE MASTECTOMY WITH AXILLARY SENTINEL NODE BIOPSY;  Surgeon: Erroll Luna, MD;  Location: [place];  Service: General;  Laterality: Left;  . TUBAL LIGATION      Family History  Problem Relation Age of Onset  . Hypertension Mother   . Diabetes Mother   . Other Father     sleeping disease    Social History   Social History  . Marital status: Widowed    Spouse name: N/A  . Number of children: N/A  . Years of education: N/A   Social History Main Topics  . Smoking status: Current Every Day Smoker    Packs/day: 0.25    Years: 20.00    Types: Cigarettes  . Smokeless tobacco: Never Used  . Alcohol use No  . Drug use: No  . Sexual activity: Not Currently    Birth control/ protection: Surgical   Other Topics Concern  . None   Social History Narrative  . None     PHYSICAL EXAMINATION  ECOG PERFORMANCE STATUS: 1 - Symptomatic but completely ambulatory  Vitals:   03/03/16 1408  BP: (!) 166/88  Pulse: (!) 48  Resp: 18  Temp: 97.9 F (36.6 C)    GENERAL:alert, no distress, well nourished, well developed, comfortable, cooperative, obese, smiling and Unaccompanied. SKIN: skin color, texture, turgor are normal, no rashes or significant lesions HEAD: Normocephalic, No masses, lesions, tenderness or abnormalities EYES: normal, EOMI, Conjunctiva are pink and non-injected EARS: External ears normal OROPHARYNX:lips, buccal mucosa, and tongue normal and mucous membranes are moist  NECK: supple, trachea midline LYMPH:  not  examined BREAST:not examined LUNGS: clear to auscultation  HEART: regular rate & rhythm ABDOMEN:abdomen soft and obese.  Normal bowel sounds x 4 quadrants. BACK: Back symmetric, no curvature. EXTREMITIES:less then 2 second capillary refill, no joint deformities, effusion, or inflammation, no skin discoloration, no cyanosis  NEURO: alert & oriented x 3 with fluent speech, no focal motor/sensory deficits   LABORATORY DATA: CBC    Component Value Date/Time   WBC 7.1 02/22/2016 0941   RBC 4.56 02/22/2016 0941   HGB 14.0 02/22/2016 0941   HCT 38.7 02/22/2016 0941   PLT 259 02/22/2016 0941   MCV 84.9 02/22/2016 0941   MCH 30.7 02/22/2016 0941   MCHC 36.2 (H) 02/22/2016 0941   RDW 13.8 02/22/2016 0941   LYMPHSABS 2.9 02/22/2016 0941   MONOABS 0.5 02/22/2016 0941   EOSABS 0.2 02/22/2016 0941   BASOSABS 0.0 02/22/2016 0941      Chemistry  Component Value Date/Time   NA 139 02/22/2016 0941   K 3.2 (L) 02/22/2016 0941   CL 106 02/22/2016 0941   CO2 27 02/22/2016 0941   BUN 12 02/22/2016 0941   CREATININE 0.84 02/22/2016 0941      Component Value Date/Time   CALCIUM 9.2 02/22/2016 0941   ALKPHOS 98 02/22/2016 0941   AST 18 02/22/2016 0941   ALT 20 02/22/2016 0941   BILITOT 0.6 02/22/2016 0941        PENDING LABS:   RADIOGRAPHIC STUDIES:  US Guided Needle Placement  Result Date: 02/14/2016 CLINICAL DATA: cancer, cath placement Ultrasound was provided for use by the ordering physician, and a technical charge was applied by the performing facility.  No radiologist interpretation/professional services rendered.   [place] Intraoperative  Result Date: 02/14/2016 CLINICAL DATA: cancer, cath placement Ultrasound was provided for use by the ordering physician, and a technical charge was applied by the performing facility.  No radiologist interpretation/professional services rendered.   Dg Chest Port 1 View  Result Date: 02/14/2016 CLINICAL DATA:  Status post central  venous catheter placement EXAM: PORTABLE CHEST 1 VIEW COMPARISON:  01/30/2016 FINDINGS: There is a right chest wall port a catheter with tip in the projection of the SVC. There is no pneumothorax identified after port placement. Normal heart size. No pleural effusion or edema identified. No airspace consolidation. IMPRESSION: 1. No complications following Port-A-Cath placement. Electronically Signed   By: Kerby Moors M.D.   On: 02/14/2016 10:54   Dg C-arm 1-60 Min-no Report  Result Date: 02/14/2016 CLINICAL DATA: port a cath insertion C-ARM 1-60 MINUTES Fluoroscopy was utilized by the requesting physician.  No radiographic interpretation.     PATHOLOGY:    ASSESSMENT AND PLAN:  Cancer of left breast, stage 2 (HCC) Stage IIB (T2(2)N1M0) invasive ductal adenocarcinoma of left breat with multifocal disease, ER/PR+, HER2 NEGATIVE, and 1/1 sentinel lymph node positive for disease.  Now on adjuvant therapy with CMF.  Oncology history is up to date.  NADIR labs today: CBC diff, CMET.  I personally reviewed and went over laboratory results with the patient.  The results are noted within this dictation.  Return as scheduled for follow-up and cycle #2 of treatment.   ORDERS PLACED FOR THIS ENCOUNTER: No orders of the defined types were placed in this encounter.   MEDICATIONS PRESCRIBED THIS ENCOUNTER: Meds ordered this encounter  Medications  . benazepril (LOTENSIN) 40 MG tablet    THERAPY PLAN:  CMF as planned.  All questions were answered. The patient knows to call the clinic with any problems, questions or concerns. We can certainly see the patient much sooner if necessary.  Patient and plan discussed with Dr. Ancil Linsey and she is in agreement with the aforementioned.   This note is electronically signed by: Doy Mince 03/03/2016 2:44 PM

## 2016-03-03 NOTE — Assessment & Plan Note (Signed)
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  Now on adjuvant therapy with CMF.  Oncology history is up to date.  NADIR labs today: CBC diff, CMET.  I personally reviewed and went over laboratory results with the patient.  The results are noted within this dictation.  Return as scheduled for follow-up and cycle #2 of treatment.

## 2016-03-14 ENCOUNTER — Ambulatory Visit (HOSPITAL_COMMUNITY): Payer: Medicare HMO

## 2016-03-14 ENCOUNTER — Ambulatory Visit (HOSPITAL_COMMUNITY): Payer: Medicare HMO | Admitting: Hematology & Oncology

## 2016-03-14 ENCOUNTER — Other Ambulatory Visit (HOSPITAL_COMMUNITY): Payer: Self-pay | Admitting: Pharmacist

## 2016-03-17 ENCOUNTER — Encounter (HOSPITAL_BASED_OUTPATIENT_CLINIC_OR_DEPARTMENT_OTHER): Payer: Medicare HMO | Admitting: Oncology

## 2016-03-17 ENCOUNTER — Encounter (HOSPITAL_COMMUNITY): Payer: Medicare HMO | Attending: Hematology & Oncology

## 2016-03-17 ENCOUNTER — Encounter (HOSPITAL_COMMUNITY): Payer: Self-pay

## 2016-03-17 VITALS — BP 136/76 | HR 61 | Temp 97.8°F | Resp 16 | Wt 204.5 lb

## 2016-03-17 DIAGNOSIS — Z170 Estrogen receptor positive status [ER+]: Secondary | ICD-10-CM | POA: Diagnosis not present

## 2016-03-17 DIAGNOSIS — C773 Secondary and unspecified malignant neoplasm of axilla and upper limb lymph nodes: Secondary | ICD-10-CM

## 2016-03-17 DIAGNOSIS — C50912 Malignant neoplasm of unspecified site of left female breast: Secondary | ICD-10-CM

## 2016-03-17 DIAGNOSIS — Z5111 Encounter for antineoplastic chemotherapy: Secondary | ICD-10-CM

## 2016-03-17 DIAGNOSIS — Z720 Tobacco use: Secondary | ICD-10-CM

## 2016-03-17 LAB — COMPREHENSIVE METABOLIC PANEL
ALBUMIN: 3.8 g/dL (ref 3.5–5.0)
ALK PHOS: 105 U/L (ref 38–126)
ALT: 18 U/L (ref 14–54)
AST: 17 U/L (ref 15–41)
Anion gap: 6 (ref 5–15)
BUN: 12 mg/dL (ref 6–20)
CALCIUM: 8.8 mg/dL — AB (ref 8.9–10.3)
CHLORIDE: 106 mmol/L (ref 101–111)
CO2: 28 mmol/L (ref 22–32)
CREATININE: 0.86 mg/dL (ref 0.44–1.00)
GFR calc non Af Amer: >60 mL/min (ref >60)
GLUCOSE: 121 mg/dL — AB (ref 65–99)
Potassium: 3.4 mmol/L — ABNORMAL LOW (ref 3.5–5.1)
SODIUM: 140 mmol/L (ref 135–145)
Total Bilirubin: 0.7 mg/dL (ref 0.3–1.2)
Total Protein: 7.3 g/dL (ref 6.5–8.1)

## 2016-03-17 LAB — CBC WITH DIFFERENTIAL/PLATELET
BASOS ABS: 0.1 10*3/uL (ref 0.0–0.1)
BASOS PCT: 1 %
EOS ABS: 0.1 10*3/uL (ref 0.0–0.7)
EOS PCT: 1 %
HCT: 38.5 % (ref 36.0–46.0)
HEMOGLOBIN: 13.8 g/dL (ref 12.0–15.0)
LYMPHS ABS: 3 10*3/uL (ref 0.7–4.0)
Lymphocytes Relative: 51 %
MCH: 30.7 pg (ref 26.0–34.0)
MCHC: 35.8 g/dL (ref 30.0–36.0)
MCV: 85.7 fL (ref 78.0–100.0)
Monocytes Absolute: 0.5 10*3/uL (ref 0.1–1.0)
Monocytes Relative: 8 %
NEUTROS PCT: 39 %
Neutro Abs: 2.3 10*3/uL (ref 1.7–7.7)
PLATELETS: 255 10*3/uL (ref 150–400)
RBC: 4.49 MIL/uL (ref 3.87–5.11)
RDW: 15 % (ref 11.5–15.5)
WBC: 6 10*3/uL (ref 4.0–10.5)

## 2016-03-17 MED ORDER — PALONOSETRON HCL INJECTION 0.25 MG/5ML
0.2500 mg | Freq: Once | INTRAVENOUS | Status: AC
  Administered 2016-03-17: 0.25 mg via INTRAVENOUS
  Filled 2016-03-17: qty 5

## 2016-03-17 MED ORDER — SODIUM CHLORIDE 0.9% FLUSH: 10.0000 mL | INTRAVENOUS | Status: DC | PRN

## 2016-03-17 MED ORDER — DEXAMETHASONE SODIUM PHOSPHATE 10 MG/ML IJ SOLN
10.0000 mg | Freq: Once | INTRAMUSCULAR | Status: AC
  Administered 2016-03-17: 10 mg via INTRAVENOUS
  Filled 2016-03-17: qty 1

## 2016-03-17 MED ORDER — SODIUM CHLORIDE 0.9 % IV SOLN
600.0000 mg/m2 | Freq: Once | INTRAVENOUS | Status: AC
  Administered 2016-03-17: 1240 mg via INTRAVENOUS
  Filled 2016-03-17: qty 12

## 2016-03-17 MED ORDER — HEPARIN SOD (PORK) LOCK FLUSH 100 UNIT/ML IV SOLN
500.0000 [IU] | Freq: Once | INTRAVENOUS | Status: AC | PRN
  Administered 2016-03-17: 500 [IU]
  Filled 2016-03-17: qty 5

## 2016-03-17 MED ORDER — FLUOROURACIL CHEMO INJECTION 2.5 GM/50ML
600.0000 mg/m2 | Freq: Once | INTRAVENOUS | Status: AC
  Administered 2016-03-17: 1250 mg via INTRAVENOUS
  Filled 2016-03-17: qty 25

## 2016-03-17 MED ORDER — SODIUM CHLORIDE 0.9 % IV SOLN
Freq: Once | INTRAVENOUS | Status: AC
  Administered 2016-03-17: 11:00:00 via INTRAVENOUS

## 2016-03-17 MED ORDER — PEGFILGRASTIM 6 MG/0.6ML ~~LOC~~ PSKT: 6.0000 mg | PREFILLED_SYRINGE | Freq: Once | SUBCUTANEOUS | Status: DC

## 2016-03-17 MED ORDER — METHOTREXATE SODIUM (PF) CHEMO INJECTION 250 MG/10ML
40.0000 mg/m2 | Freq: Once | INTRAMUSCULAR | Status: AC
  Administered 2016-03-17: 82 mg via INTRAVENOUS
  Filled 2016-03-17: qty 3.28

## 2016-03-17 NOTE — Assessment & Plan Note (Addendum)
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  Now on adjuvant therapy with CMF.  Oncology history is up to date.  Pre-treatment labs today: CBC diff, CMET.  I personally reviewed and went over laboratory results with the patient.  The results are noted within this dictation.  Hypokalemia is improving and she will continue with K+ replacement at home.  Return in 3 weeks for follow-up and cycle #3 of treatment.

## 2016-03-17 NOTE — Progress Notes (Signed)
Pcp Not In System No address on file  Cancer of left breast, stage 2 ([place]) - Plan: CBC with Differential, Comprehensive metabolic panel  CURRENT THERAPY: Starting CMF today, 02/22/2016  INTERVAL HISTORY: Julie Parker 60 y.o. female returns for followup of Stage IIB (T2(2)N1M0) invasive ductal adenocarcinoma of left breat with multifocal disease, ER/PR+, HER2 NEGATIVE, and 1/1 sentinel lymph node positive for disease.  Now on adjuvant therapy with CMF.   Oncology History   Multifocal L breast cancer     Cancer of left breast, stage 2 ([place])   07/04/2015 Mammogram    Digital mammography reveals 3 abnormal areas in the left breast, 2:00 position, 1.6 x 1.4 cm palpable mass, 10:00 position 1.7 x 1.4 cm palpable mass, upper outer quadrant 1.5-1.1 pleomorphic calcifications        07/04/2015 Initial Biopsy    Biopsies reveals invasive ductal carcinoma at the 2:00 position in the 10:00 position, ER positive greater than 90%, PR +1-5%, HER-2 negative Ki-67 marker 26%, the second tumor greater than 90% ER positive, 80% PR positive, HER-2 negative, Ki-67 marker 24%          08/17/2015 Imaging    MRI breast: 1.4 cm area of nodular and linear enhancement in the upper inner quadrant of the LEFT breast suspicious for ductal carcinoma in situ and possible invasive ductal carcinoma. There are no findings suspicious for malignancy in the right breast. 2.1 cm biopsy-proven invasive ductal carcinoma in the 10 o'clock position of the left breast. 1.7 cm biopsy-proven invasive ductal carcinoma in the 2 o'clock position of the left breast       12/13/2015 Initial Diagnosis    Breast cancer, left ([place])     12/13/2015 Surgery    Multifocal L breast cancer, L simple mastectomy with L sentinel LN mapping      12/18/2015 Pathology Results    Invasive ductal adenocarcinoma, grade 3, spanning 2.6 cm and 1.9 cm. Ductal carcinoma in situ, grade 3. Lymphovascular invasion identified. Margins of  resection were negative for carcinoma. 1/1 metastatic ductal adenocarcinoma in left axillary sentinel lymph node biopsy.      12/18/2015 Pathology Results    ER 100% positive, PR 100% positive, KI-67 15%, HER-2 negative.      02/22/2016 -  Chemotherapy    The patient had palonosetron (ALOXI) injection 0.25 mg, 0.25 mg, Intravenous,  Once, 1 of 6 cycles  methotrexate (PF) chemo injection 82 mg, 40 mg/m2 = 82 mg, Intravenous,  Once, 1 of 6 cycles  pegfilgrastim (NEULASTA ONPRO KIT) injection 6 mg, 6 mg, Subcutaneous, Once, 1 of 6 cycles  cyclophosphamide (CYTOXAN) 1,240 mg in sodium chloride 0.9 % 250 mL chemo infusion, 600 mg/m2 = 1,240 mg, Intravenous,  Once, 1 of 6 cycles  fluorouracil (ADRUCIL) chemo injection 1,250 mg, 600 mg/m2 = 1,250 mg, Intravenous,  Once, 1 of 6 cycles  for chemotherapy treatment.         She reports ongoing tolerance to treatment.  She denies any nausea, vomiting, decreased appetite, weight loss, fevers, chills, change in bowel habits.    Review of Systems  Constitutional: Negative.  Negative for chills, fever and weight loss.  HENT: Negative.   Eyes: Negative.  Negative for double vision.  Respiratory: Negative.  Negative for cough.   Cardiovascular: Negative.  Negative for chest pain.  Gastrointestinal: Negative.  Negative for constipation, diarrhea, nausea and vomiting.  Genitourinary: Negative.  Negative for dysuria.  Musculoskeletal: Negative.  Negative for falls.  Skin: Negative.  Negative for rash.  Neurological: Positive for focal weakness (right side, chronic). Negative for weakness.  Endo/Heme/Allergies: Negative.   Psychiatric/Behavioral: Negative.     Past Medical History:  Diagnosis Date  . Arthritis   . Cancer [facility])    left breast cancer  . Cancer of left breast, stage 2 ([place]) 12/13/2015  . Hypertension   . Pneumonia   . Stroke [facility])    2 strokes, right side weakness    Past Surgical History:  Procedure Laterality Date  .  ABDOMINAL HYSTERECTOMY    . FOOT SURGERY Right   . PORTACATH PLACEMENT Right 02/14/2016   Procedure: INSERTION PORT-A-CATH;  Surgeon: Vickie Epley, MD;  Location: [facility];  Service: Vascular;  Laterality: Right;  . SIMPLE MASTECTOMY WITH AXILLARY SENTINEL NODE BIOPSY Left 12/13/2015   Procedure: LEFT SIMPLE MASTECTOMY WITH AXILLARY SENTINEL NODE BIOPSY;  Surgeon: Erroll Luna, MD;  Location: [place];  Service: General;  Laterality: Left;  . TUBAL LIGATION      Family History  Problem Relation Age of Onset  . Hypertension Mother   . Diabetes Mother   . Other Father     sleeping disease    Social History   Social History  . Marital status: Widowed    Spouse name: N/A  . Number of children: N/A  . Years of education: N/A   Social History Main Topics  . Smoking status: Current Every Day Smoker    Packs/day: 0.25    Years: 20.00    Types: Cigarettes  . Smokeless tobacco: Never Used  . Alcohol use No  . Drug use: No  . Sexual activity: Not Currently    Birth control/ protection: Surgical   Other Topics Concern  . Not on file   Social History Narrative  . No narrative on file     PHYSICAL EXAMINATION  ECOG PERFORMANCE STATUS: 1 - Symptomatic but completely ambulatory  There were no vitals filed for this visit.  Vitals - 1 value per visit 68/12/8108  SYSTOLIC 315  DIASTOLIC 96  Pulse 50  Temperature 98.4  Respirations 18  Weight (lb) 204.5    GENERAL:alert, no distress, well nourished, well developed, comfortable, cooperative, obese, smiling and Unaccompanied, in chemo-recliner. SKIN: skin color, texture, turgor are normal, no rashes or significant lesions HEAD: Normocephalic, No masses, lesions, tenderness or abnormalities EYES: normal, EOMI, Conjunctiva are pink and non-injected EARS: External ears normal OROPHARYNX:lips, buccal mucosa, and tongue normal and mucous membranes are moist  NECK: supple, trachea midline LYMPH:  not examined BREAST:not  examined LUNGS: clear to auscultation  HEART: regular rate & rhythm ABDOMEN:abdomen soft and obese.  Normal bowel sounds x 4 quadrants. BACK: Back symmetric, no curvature. EXTREMITIES:less then 2 second capillary refill, no joint deformities, effusion, or inflammation, no skin discoloration, no cyanosis  NEURO: alert & oriented x 3 with fluent speech, no focal motor/sensory deficits   LABORATORY DATA: CBC    Component Value Date/Time   WBC 6.0 03/17/2016 0931   RBC 4.49 03/17/2016 0931   HGB 13.8 03/17/2016 0931   HCT 38.5 03/17/2016 0931   PLT 255 03/17/2016 0931   MCV 85.7 03/17/2016 0931   MCH 30.7 03/17/2016 0931   MCHC 35.8 03/17/2016 0931   RDW 15.0 03/17/2016 0931   LYMPHSABS 3.0 03/17/2016 0931   MONOABS 0.5 03/17/2016 0931   EOSABS 0.1 03/17/2016 0931   BASOSABS 0.1 03/17/2016 0931      Chemistry      Component Value Date/Time  NA 140 03/17/2016 0931   K 3.4 (L) 03/17/2016 0931   CL 106 03/17/2016 0931   CO2 28 03/17/2016 0931   BUN 12 03/17/2016 0931   CREATININE 0.86 03/17/2016 0931      Component Value Date/Time   CALCIUM 8.8 (L) 03/17/2016 0931   ALKPHOS 105 03/17/2016 0931   AST 17 03/17/2016 0931   ALT 18 03/17/2016 0931   BILITOT 0.7 03/17/2016 0931        PENDING LABS:   RADIOGRAPHIC STUDIES:  No results found.   PATHOLOGY:    ASSESSMENT AND PLAN:  Cancer of left breast, stage 2 (HCC) Stage IIB (T2(2)N1M0) invasive ductal adenocarcinoma of left breat with multifocal disease, ER/PR+, HER2 NEGATIVE, and 1/1 sentinel lymph node positive for disease.  Now on adjuvant therapy with CMF.  Oncology history is up to date.  Pre-treatment labs today: CBC diff, CMET.  I personally reviewed and went over laboratory results with the patient.  The results are noted within this dictation.  Hypokalemia is improving and she will continue with K+ replacement at home.  Return in 3 weeks for follow-up and cycle #3 of treatment.   ORDERS PLACED FOR  THIS ENCOUNTER: No orders of the defined types were placed in this encounter.   MEDICATIONS PRESCRIBED THIS ENCOUNTER: No orders of the defined types were placed in this encounter.   THERAPY PLAN:  CMF as planned.  All questions were answered. The patient knows to call the clinic with any problems, questions or concerns. We can certainly see the patient much sooner if necessary.  Patient and plan discussed with Dr. Ancil Linsey and she is in agreement with the aforementioned.   This note is electronically signed by: Robynn Pane, PA-C 03/17/2016 10:20 AM

## 2016-03-17 NOTE — Progress Notes (Signed)
Tolerated infusion w/o adverse reaction.  Alert, in no distress. VSS.  Discharged via wheelchair. 

## 2016-03-17 NOTE — Patient Instructions (Signed)
[place] at [facility] Discharge Instructions  RECOMMENDATIONS MADE BY THE CONSULTANT AND ANY TEST RESULTS WILL BE SENT TO YOUR REFERRING PHYSICIAN.  Treatment today  Labs in 3 weeks, follow up in 3 weeks, treatment in 3 weeks   Thank you for choosing [place] at [facility] to provide your oncology and hematology care.  To afford each patient quality time with our provider, please arrive at least 15 minutes before your scheduled appointment time.   Beginning January 23rd 2017 lab work for the [facility] will be done in the  Main lab at [facility] on 1st floor. If you have a lab appointment with the [place] please come in thru the  Main Entrance and check in at the main information desk  You need to re-schedule your appointment should you arrive 10 or more minutes late.  We strive to give you quality time with our providers, and arriving late affects you and other patients whose appointments are after yours.  Also, if you no show three or more times for appointments you may be dismissed from the clinic at the providers discretion.     Again, thank you for choosing [facility].  Our hope is that these requests will decrease the amount of time that you wait before being seen by our physicians.       _____________________________________________________________  Should you have questions after your visit to [facility], please contact our office at [phone] between the hours of 8:30 a.m. and 4:30 p.m.  Voicemails left after 4:30 p.m. will not be returned until the following business day.  For prescription refill requests, have your pharmacy contact our office.         Resources For Cancer Patients and their Caregivers ? American Cancer Society: Can assist with transportation, wigs, general needs, runs Look Good Feel Better.        681 514 4363 ? Cancer Care: Provides financial assistance,  online support groups, medication/co-pay assistance.  1-800-813-HOPE (240)391-2381) ? [place] Assists [place] cancer patients and their families through emotional , educational and financial support.  667-811-9584 ? [place] DSS Where to apply for food stamps, Medicaid and utility assistance. (561)160-3904 ? [facility]: Transportation to medical appointments. (402)270-5552 ? Social Security Administration: May apply for disability if have a Stage IV cancer. 307-635-1463 (712) 511-6927 ? [employer], Disability and Transit Services: Assists with nutrition, care and transit needs. [place] Support Programs: @10RELATIVEDAYS @ > Cancer Support Group  2nd Tuesday of the month 1pm-2pm, Journey Room  > Creative Journey  3rd Tuesday of the month 1130am-1pm, Journey Room  > Look Good Feel Better  1st Wednesday of the month 10am-12 noon, Journey Room (Call [place] to register [phone])

## 2016-03-17 NOTE — Patient Instructions (Signed)
[facility] Discharge Instructions for Patients Receiving Chemotherapy   Beginning January 23rd 2017 lab work for the [facility] will be done in the  Main lab at [facility] on 1st floor. If you have a lab appointment with the [facility] please come in thru the  Main Entrance and check in at the main information desk   Today you received the following chemotherapy agents:  Methotrexate, Cytoxan, and 5FU  To help prevent nausea and vomiting after your treatment, we encourage you to take your nausea medication as prescribed.  If you develop nausea and vomiting, or diarrhea that is not controlled by your medication, call the clinic.  The clinic phone number is [phone]. Office hours are Monday-Friday 8:30am-5:00pm.  BELOW ARE SYMPTOMS THAT SHOULD BE REPORTED IMMEDIATELY:  *FEVER GREATER THAN 101.0 F  *CHILLS WITH OR WITHOUT FEVER  NAUSEA AND VOMITING THAT IS NOT CONTROLLED WITH YOUR NAUSEA MEDICATION  *UNUSUAL SHORTNESS OF BREATH  *UNUSUAL BRUISING OR BLEEDING  TENDERNESS IN MOUTH AND THROAT WITH OR WITHOUT PRESENCE OF ULCERS  *URINARY PROBLEMS  *BOWEL PROBLEMS  UNUSUAL RASH Items with * indicate a potential emergency and should be followed up as soon as possible. If you have an emergency after office hours please contact your primary care physician or go to the nearest emergency department.  Please call the clinic during office hours if you have any questions or concerns.   You may also contact the Patient Navigator at [phone] should you have any questions or need assistance in obtaining follow up care.      Resources For Cancer Patients and their Caregivers ? American Cancer Society: Can assist with transportation, wigs, general needs, runs Look Good Feel Better.        607-591-7254 ? Cancer Care: Provides financial assistance, online support groups, medication/co-pay assistance.  1-800-813-HOPE (437)421-5976) ? [place] Assists [place] cancer patients and their families through emotional , educational and financial support.  (708)194-8068 ? [place] DSS Where to apply for food stamps, Medicaid and utility assistance. 334-539-1227 ? RCATS: Transportation to medical appointments. [phone] ? Social Security Administration: May apply for disability if have a Stage IV cancer. 778-217-9417 502-322-7073 ? [employer], Disability and Transit Services: Assists with nutrition, care and transit needs. [phone]

## 2016-03-19 ENCOUNTER — Encounter (HOSPITAL_BASED_OUTPATIENT_CLINIC_OR_DEPARTMENT_OTHER): Payer: Medicare HMO

## 2016-03-19 VITALS — BP 149/80 | HR 51 | Temp 98.4°F | Resp 16

## 2016-03-19 DIAGNOSIS — Z5189 Encounter for other specified aftercare: Secondary | ICD-10-CM | POA: Diagnosis not present

## 2016-03-19 DIAGNOSIS — C50912 Malignant neoplasm of unspecified site of left female breast: Secondary | ICD-10-CM | POA: Diagnosis not present

## 2016-03-19 MED ORDER — PEGFILGRASTIM INJECTION 6 MG/0.6ML ~~LOC~~
6.0000 mg | PREFILLED_SYRINGE | Freq: Once | SUBCUTANEOUS | Status: AC
  Administered 2016-03-19: 6 mg via SUBCUTANEOUS
  Filled 2016-03-19: qty 0.6

## 2016-03-19 NOTE — Patient Instructions (Signed)
[place] at [facility] Discharge Instructions  RECOMMENDATIONS MADE BY THE CONSULTANT AND ANY TEST RESULTS WILL BE SENT TO YOUR REFERRING PHYSICIAN.  Neulasta 6 mg injection given today as ordered. Return as scheduled.  Thank you for choosing [place] at [facility] to provide your oncology and hematology care.  To afford each patient quality time with our provider, please arrive at least 15 minutes before your scheduled appointment time.   Beginning January 23rd 2017 lab work for the [facility] will be done in the  Main lab at [facility] on 1st floor. If you have a lab appointment with the [facility] please come in thru the  Main Entrance and check in at the main information desk  You need to re-schedule your appointment should you arrive 10 or more minutes late.  We strive to give you quality time with our providers, and arriving late affects you and other patients whose appointments are after yours.  Also, if you no show three or more times for appointments you may be dismissed from the clinic at the providers discretion.     Again, thank you for choosing [facility].  Our hope is that these requests will decrease the amount of time that you wait before being seen by our physicians.       _____________________________________________________________  Should you have questions after your visit to [facility], please contact our office at [phone] between the hours of 8:30 a.m. and 4:30 p.m.  Voicemails left after 4:30 p.m. will not be returned until the following business day.  For prescription refill requests, have your pharmacy contact our office.         Resources For Cancer Patients and their Caregivers ? American Cancer Society: Can assist with transportation, wigs, general needs, runs Look Good Feel Better.        807-303-5607 ? Cancer Care: Provides financial assistance, online support  groups, medication/co-pay assistance.  1-800-813-HOPE 360 250 7179) ? [place] Assists [place] cancer patients and their families through emotional , educational and financial support.  424-848-1069 ? [place] DSS Where to apply for food stamps, Medicaid and utility assistance. 4104628127 ? RCATS: Transportation to medical appointments. 2692174538 ? Social Security Administration: May apply for disability if have a Stage IV cancer. [phone] 534-178-9752 ? [employer], Disability and Transit Services: Assists with nutrition, care and transit needs. [place] Support Programs: @10RELATIVEDAYS @ > Cancer Support Group  2nd Tuesday of the month 1pm-2pm, Journey Room  > Creative Journey  3rd Tuesday of the month 1130am-1pm, Journey Room  > Look Good Feel Better  1st Wednesday of the month 10am-12 noon, Journey Room (Call [place] to register [phone])

## 2016-03-19 NOTE — Progress Notes (Signed)
Julie Parker presents today for injection per MD orders. Neulasta 6mg  administered SQ in left Abdomen. Administration without incident. Patient tolerated well. Denies any complaints post chemo.

## 2016-04-04 ENCOUNTER — Ambulatory Visit (HOSPITAL_COMMUNITY): Payer: Medicare HMO

## 2016-04-04 ENCOUNTER — Ambulatory Visit (HOSPITAL_COMMUNITY): Payer: Medicare HMO | Admitting: Hematology & Oncology

## 2016-04-07 ENCOUNTER — Encounter (HOSPITAL_COMMUNITY): Payer: Medicare HMO

## 2016-04-07 ENCOUNTER — Encounter (HOSPITAL_COMMUNITY): Payer: Self-pay | Admitting: Hematology & Oncology

## 2016-04-07 ENCOUNTER — Encounter (HOSPITAL_COMMUNITY): Payer: Medicare HMO | Attending: Hematology & Oncology | Admitting: Hematology & Oncology

## 2016-04-07 VITALS — BP 101/78 | HR 55 | Temp 97.9°F | Resp 16 | Wt 207.0 lb

## 2016-04-07 VITALS — BP 165/77 | HR 55 | Temp 98.0°F | Resp 18

## 2016-04-07 DIAGNOSIS — E876 Hypokalemia: Secondary | ICD-10-CM

## 2016-04-07 DIAGNOSIS — Z5111 Encounter for antineoplastic chemotherapy: Secondary | ICD-10-CM

## 2016-04-07 DIAGNOSIS — Z720 Tobacco use: Secondary | ICD-10-CM

## 2016-04-07 DIAGNOSIS — Z8673 Personal history of transient ischemic attack (TIA), and cerebral infarction without residual deficits: Secondary | ICD-10-CM

## 2016-04-07 DIAGNOSIS — C50912 Malignant neoplasm of unspecified site of left female breast: Secondary | ICD-10-CM | POA: Insufficient documentation

## 2016-04-07 DIAGNOSIS — C50412 Malignant neoplasm of upper-outer quadrant of left female breast: Secondary | ICD-10-CM | POA: Diagnosis not present

## 2016-04-07 DIAGNOSIS — C773 Secondary and unspecified malignant neoplasm of axilla and upper limb lymph nodes: Secondary | ICD-10-CM | POA: Diagnosis not present

## 2016-04-07 DIAGNOSIS — I6930 Unspecified sequelae of cerebral infarction: Secondary | ICD-10-CM

## 2016-04-07 DIAGNOSIS — C50212 Malignant neoplasm of upper-inner quadrant of left female breast: Secondary | ICD-10-CM

## 2016-04-07 LAB — CBC WITH DIFFERENTIAL/PLATELET
Basophils Absolute: 0.1 10*3/uL (ref 0.0–0.1)
Basophils Relative: 1 %
Eosinophils Absolute: 0.2 10*3/uL (ref 0.0–0.7)
Eosinophils Relative: 3 %
HEMATOCRIT: 38.9 % (ref 36.0–46.0)
Hemoglobin: 13.9 g/dL (ref 12.0–15.0)
LYMPHS ABS: 2.1 10*3/uL (ref 0.7–4.0)
LYMPHS PCT: 35 %
MCH: 30.8 pg (ref 26.0–34.0)
MCHC: 35.7 g/dL (ref 30.0–36.0)
MCV: 86.1 fL (ref 78.0–100.0)
Monocytes Absolute: 0.6 10*3/uL (ref 0.1–1.0)
Monocytes Relative: 10 %
NEUTROS ABS: 3.2 10*3/uL (ref 1.7–7.7)
Neutrophils Relative %: 51 %
Platelets: 234 10*3/uL (ref 150–400)
RBC: 4.52 MIL/uL (ref 3.87–5.11)
RDW: 15.3 % (ref 11.5–15.5)
WBC: 6.1 10*3/uL (ref 4.0–10.5)

## 2016-04-07 LAB — COMPREHENSIVE METABOLIC PANEL
ALK PHOS: 121 U/L (ref 38–126)
ALT: 18 U/L (ref 14–54)
AST: 16 U/L (ref 15–41)
Albumin: 3.7 g/dL (ref 3.5–5.0)
Anion gap: 7 (ref 5–15)
BILIRUBIN TOTAL: 0.6 mg/dL (ref 0.3–1.2)
BUN: 16 mg/dL (ref 6–20)
CALCIUM: 8.8 mg/dL — AB (ref 8.9–10.3)
CO2: 27 mmol/L (ref 22–32)
CREATININE: 0.89 mg/dL (ref 0.44–1.00)
Chloride: 106 mmol/L (ref 101–111)
Glucose, Bld: 153 mg/dL — ABNORMAL HIGH (ref 65–99)
Potassium: 3.5 mmol/L (ref 3.5–5.1)
Sodium: 140 mmol/L (ref 135–145)
TOTAL PROTEIN: 7.1 g/dL (ref 6.5–8.1)

## 2016-04-07 MED ORDER — SODIUM CHLORIDE 0.9% FLUSH: 10.0000 mL | INTRAVENOUS | Status: DC | PRN

## 2016-04-07 MED ORDER — SODIUM CHLORIDE 0.9 % IV SOLN
Freq: Once | INTRAVENOUS | Status: AC
  Administered 2016-04-07: 10:00:00 via INTRAVENOUS

## 2016-04-07 MED ORDER — PALONOSETRON HCL INJECTION 0.25 MG/5ML
INTRAVENOUS | Status: AC
  Filled 2016-04-07: qty 5

## 2016-04-07 MED ORDER — SODIUM CHLORIDE 0.9 % IV SOLN
600.0000 mg/m2 | Freq: Once | INTRAVENOUS | Status: AC
  Administered 2016-04-07: 1240 mg via INTRAVENOUS
  Filled 2016-04-07: qty 50

## 2016-04-07 MED ORDER — PALONOSETRON HCL INJECTION 0.25 MG/5ML
0.2500 mg | Freq: Once | INTRAVENOUS | Status: AC
  Administered 2016-04-07: 0.25 mg via INTRAVENOUS

## 2016-04-07 MED ORDER — METHOTREXATE SODIUM (PF) CHEMO INJECTION 250 MG/10ML
40.0000 mg/m2 | Freq: Once | INTRAMUSCULAR | Status: AC
  Administered 2016-04-07: 82 mg via INTRAVENOUS
  Filled 2016-04-07: qty 3.28

## 2016-04-07 MED ORDER — HEPARIN SOD (PORK) LOCK FLUSH 100 UNIT/ML IV SOLN
500.0000 [IU] | Freq: Once | INTRAVENOUS | Status: AC | PRN
  Administered 2016-04-07: 500 [IU]
  Filled 2016-04-07: qty 5

## 2016-04-07 MED ORDER — FLUOROURACIL CHEMO INJECTION 2.5 GM/50ML
600.0000 mg/m2 | Freq: Once | INTRAVENOUS | Status: AC
  Administered 2016-04-07: 1250 mg via INTRAVENOUS
  Filled 2016-04-07: qty 25

## 2016-04-07 MED ORDER — DEXAMETHASONE SODIUM PHOSPHATE 10 MG/ML IJ SOLN
10.0000 mg | Freq: Once | INTRAMUSCULAR | Status: AC
  Administered 2016-04-07: 10 mg via INTRAVENOUS

## 2016-04-07 MED ORDER — DEXAMETHASONE SODIUM PHOSPHATE 10 MG/ML IJ SOLN
INTRAMUSCULAR | Status: AC
  Filled 2016-04-07: qty 1

## 2016-04-07 NOTE — Progress Notes (Signed)
Tolerated tx w/o adverse reaction.  Alert, in no distress.  VSS.  Discharged via wheelchair.

## 2016-04-07 NOTE — Patient Instructions (Signed)
[facility] Discharge Instructions for Patients Receiving Chemotherapy   Beginning January 23rd 2017 lab work for the [facility] will be done in the  Main lab at [facility] on 1st floor. If you have a lab appointment with the [facility] please come in thru the  Main Entrance and check in at the main information desk   Today you received the following chemotherapy agents:  Cytoxan, methotrexate, and 5FU  To help prevent nausea and vomiting after your treatment, we encourage you to take your nausea medication   If you develop nausea and vomiting, or diarrhea that is not controlled by your medication, call the clinic.  The clinic phone number is [phone]. Office hours are Monday-Friday 8:30am-5:00pm.  BELOW ARE SYMPTOMS THAT SHOULD BE REPORTED IMMEDIATELY:  *FEVER GREATER THAN 101.0 F  *CHILLS WITH OR WITHOUT FEVER  NAUSEA AND VOMITING THAT IS NOT CONTROLLED WITH YOUR NAUSEA MEDICATION  *UNUSUAL SHORTNESS OF BREATH  *UNUSUAL BRUISING OR BLEEDING  TENDERNESS IN MOUTH AND THROAT WITH OR WITHOUT PRESENCE OF ULCERS  *URINARY PROBLEMS  *BOWEL PROBLEMS  UNUSUAL RASH Items with * indicate a potential emergency and should be followed up as soon as possible. If you have an emergency after office hours please contact your primary care physician or go to the nearest emergency department.  Please call the clinic during office hours if you have any questions or concerns.   You may also contact the Patient Navigator at [phone] should you have any questions or need assistance in obtaining follow up care.      Resources For Cancer Patients and their Caregivers ? American Cancer Society: Can assist with transportation, wigs, general needs, runs Look Good Feel Better.        503-081-9245 ? Cancer Care: Provides financial assistance, online support groups, medication/co-pay assistance.  1-800-813-HOPE 4033893425) ? [place] Assists [place] cancer patients and their families through emotional , educational and financial support.  315-744-1452 ? [place] DSS Where to apply for food stamps, Medicaid and utility assistance. ([phone] ? RCATS: Transportation to medical appointments. (304) [phone] ? Social Security Administration: May apply for disability if have a Stage IV cancer. [phone] ? [employer], Disability and Transit Services: Assists with nutrition, care and transit needs. [phone]

## 2016-04-07 NOTE — Progress Notes (Signed)
[place]  PROGRESS NOTE  Patient Care Team: Pcp Not In System as PCP - General  CHIEF COMPLAINTS/PURPOSE OF CONSULTATION:  pT2pN1 multifocal carcinoma of the L breast, ER+ PR+ HER 2 -  Mastectomy HX CVA Tobacco Use High risk Mammaprint  Oncology History   Multifocal L breast cancer     Cancer of left breast, stage 2 ([place])   07/04/2015 Mammogram    Digital mammography reveals 3 abnormal areas in the left breast, 2:00 position, 1.6 x 1.4 cm palpable mass, 10:00 position 1.7 x 1.4 cm palpable mass, upper outer quadrant 1.5-1.1 pleomorphic calcifications        07/04/2015 Initial Biopsy    Biopsies reveals invasive ductal carcinoma at the 2:00 position in the 10:00 position, ER positive greater than 90%, PR +1-5%, HER-2 negative Ki-67 marker 26%, the second tumor greater than 90% ER positive, 80% PR positive, HER-2 negative, Ki-67 marker 24%          08/17/2015 Imaging    MRI breast: 1.4 cm area of nodular and linear enhancement in the upper inner quadrant of the LEFT breast suspicious for ductal carcinoma in situ and possible invasive ductal carcinoma. There are no findings suspicious for malignancy in the right breast. 2.1 cm biopsy-proven invasive ductal carcinoma in the 10 o'clock position of the left breast. 1.7 cm biopsy-proven invasive ductal carcinoma in the 2 o'clock position of the left breast       12/13/2015 Initial Diagnosis    Breast cancer, left ([place])     12/13/2015 Surgery    Multifocal L breast cancer, L simple mastectomy with L sentinel LN mapping      12/18/2015 Pathology Results    Invasive ductal adenocarcinoma, grade 3, spanning 2.6 cm and 1.9 cm. Ductal carcinoma in situ, grade 3. Lymphovascular invasion identified. Margins of resection were negative for carcinoma. 1/1 metastatic ductal adenocarcinoma in left axillary sentinel lymph node biopsy.      12/18/2015 Pathology Results    ER 100% positive, PR 100% positive, KI-67 15%, HER-2  negative.      02/22/2016 -  Chemotherapy    The patient had palonosetron (ALOXI) injection 0.25 mg, 0.25 mg, Intravenous,  Once, 1 of 6 cycles  methotrexate (PF) chemo injection 82 mg, 40 mg/m2 = 82 mg, Intravenous,  Once, 1 of 6 cycles  pegfilgrastim (NEULASTA ONPRO KIT) injection 6 mg, 6 mg, Subcutaneous, Once, 1 of 6 cycles  cyclophosphamide (CYTOXAN) 1,240 mg in sodium chloride 0.9 % 250 mL chemo infusion, 600 mg/m2 = 1,240 mg, Intravenous,  Once, 1 of 6 cycles  fluorouracil (ADRUCIL) chemo injection 1,250 mg, 600 mg/m2 = 1,250 mg, Intravenous,  Once, 1 of 6 cycles  for chemotherapy treatment.           HISTORY OF PRESENTING ILLNESS:  Julie Parker 60 y.o. female is here for omgoing follow-up of multifocal carcinoma of the L breast. Disease is ER+ PR+ (100%, 5%) and HER 2 -.She has undergone a mastectomy with Dr. Brantley Stage. She had previously seen Dr. Tressie Stalker in consultation. Mammaprint has been sent and noted to be high risk. She is here today for cycle #3 of CMF.   Julie Parker returns for ongoing therapy and follow up. She is unaccompanied and presents in wheelchair with a cane. She is doing well and admits treatment is not as bad as she thought. She denies nausea or diarrhea. She is not sleeping well at night, however this is unchanged and has been the case since  she had a stroke. She denies mouth sores.  She reports her appetite is good  She had a "great Thanksgiving" and spent it with her mother.  She denies any headaches or blurry vision No change in her neurologic symptoms. NO vomiting, CP or SOB.  MEDICAL HISTORY:  Past Medical History:  Diagnosis Date  . Arthritis   . Cancer [facility])    left breast cancer  . Cancer of left breast, stage 2 ([place]) 12/13/2015  . Hypertension   . Pneumonia   . Stroke [facility])    2 strokes, right side weakness    SURGICAL HISTORY: Past Surgical History:  Procedure Laterality Date  . ABDOMINAL HYSTERECTOMY    . FOOT SURGERY Right   .  PORTACATH PLACEMENT Right 02/14/2016   Procedure: INSERTION PORT-A-CATH;  Surgeon: Vickie Epley, MD;  Location: [facility];  Service: Vascular;  Laterality: Right;  . SIMPLE MASTECTOMY WITH AXILLARY SENTINEL NODE BIOPSY Left 12/13/2015   Procedure: LEFT SIMPLE MASTECTOMY WITH AXILLARY SENTINEL NODE BIOPSY;  Surgeon: Erroll Luna, MD;  Location: [facility];  Service: General;  Laterality: Left;  . TUBAL LIGATION      SOCIAL HISTORY: Social History   Social History  . Marital status: Widowed    Spouse name: N/A  . Number of children: N/A  . Years of education: N/A   Occupational History  . Not on file.   Social History Main Topics  . Smoking status: Current Every Day Smoker    Packs/day: 0.25    Years: 20.00    Types: Cigarettes  . Smokeless tobacco: Never Used  . Alcohol use No  . Drug use: No  . Sexual activity: Not Currently    Birth control/ protection: Surgical   Other Topics Concern  . Not on file   Social History Narrative  . No narrative on file   She currently smokes but is trying to quit. She started smoking when she was about 21. She smokes 4-5 packs a day.  She does not drink alcohol.  She enjoys playing sports, playing spades, watching tv. She does not work because she is disabled. She has four brother and three sisters.   FAMILY HISTORY: Family History  Problem Relation Age of Onset  . Hypertension Mother   . Diabetes Mother   . Other Father     sleeping disease   Dad passed away in his sleep around age 62 years old.  Mom is still living. She is about 78 years old. Mother (Julie Parker)   ALLERGIES:  is allergic to no known allergies.  MEDICATIONS:  Current Outpatient Prescriptions  Medication Sig Dispense Refill  . aspirin 81 MG EC tablet Take 1 tablet by mouth daily.    Marland Kitchen atenolol (TENORMIN) 50 MG tablet Take 1 tablet by mouth daily.    . baclofen (LIORESAL) 10 MG tablet Take 1 tablet by mouth 3 (three) times daily as needed for muscle spasms.      . benazepril (LOTENSIN) 40 MG tablet     . chlorthalidone (HYGROTON) 25 MG tablet Take 1 tablet by mouth daily.    . clopidogrel (PLAVIX) 75 MG tablet Take 1 tablet by mouth daily.    . CYCLOPHOSPHAMIDE IV Inject into the vein. Every 21 days    . dexamethasone (DECADRON) 4 MG tablet Take 2 tablets (8 mg total) by mouth daily. Start the day after chemotherapy for 2 days. Take with food. 30 tablet 1  . fluorouracil (ADRUCIL) 2.5 GM/50ML SOLN Inject into the vein  once. Injection every 21 days    . lidocaine-prilocaine (EMLA) cream Apply to affected area once 30 g 3  . LORazepam (ATIVAN) 0.5 MG tablet Take 1 tablet by mouth daily as needed for anxiety.     . methotrexate (PF) in sodium chloride 0.9 % 500 mL Inject into the vein once. Every 21 days    . ondansetron (ZOFRAN) 8 MG tablet Take 1 tablet (8 mg total) by mouth 2 (two) times daily as needed for refractory nausea / vomiting. Start on day 3 after chemotherapy. 30 tablet 1  . ondansetron (ZOFRAN-ODT) 4 MG disintegrating tablet Take 1 tablet (4 mg total) by mouth every 6 (six) hours as needed for nausea. 20 tablet 0  . oxyCODONE (OXY IR/ROXICODONE) 5 MG immediate release tablet Take 1-2 tablets (5-10 mg total) by mouth every 4 (four) hours as needed for moderate pain. 30 tablet 0  . potassium chloride (K-DUR) 10 MEQ tablet Take 1 tablet (10 mEq total) by mouth 2 (two) times daily. 60 tablet 3  . prochlorperazine (COMPAZINE) 10 MG tablet Take 1 tablet (10 mg total) by mouth every 6 (six) hours as needed (Nausea or vomiting). 30 tablet 1   No current facility-administered medications for this visit.     Review of Systems  Constitutional: Negative.   HENT: Negative.   Eyes: Negative.   Respiratory: Negative.   Cardiovascular: Negative.   Gastrointestinal: Negative.   Genitourinary: Negative.   Musculoskeletal: Negative.   Skin: Negative.   Neurological: Positive for sensory change, speech change and focal weakness.    Endo/Heme/Allergies: Negative.   Psychiatric/Behavioral: Positive for memory loss. The patient has insomnia (chronic).   All other systems reviewed and are negative. 14 point ROS was done and is otherwise as detailed above or in HPI   PHYSICAL EXAMINATION: ECOG PERFORMANCE STATUS: 1 - Symptomatic but completely ambulatory  Vitals:   04/07/16 0851  BP: 101/78  Pulse: (!) 55  Resp: 16  Temp: 97.9 F (36.6 C)   Filed Weights   04/07/16 0851  Weight: 207 lb (93.9 kg)     Physical Exam  Constitutional: She is oriented to person, place, and time and well-developed, well-nourished, and in no distress.  In wheelchair  HENT:  Head: Normocephalic and atraumatic.  Nose: Nose normal.  Mouth/Throat: Oropharynx is clear and moist. No oropharyngeal exudate.  Eyes: Conjunctivae and EOM are normal. Pupils are equal, round, and reactive to light. Right eye exhibits no discharge. Left eye exhibits no discharge. No scleral icterus.  Neck: Normal range of motion. Neck supple. No tracheal deviation present. No thyromegaly present.  Cardiovascular: Normal rate, regular rhythm and normal heart sounds.  Exam reveals no gallop and no friction rub.   No murmur heard. Pulmonary/Chest: Effort normal and breath sounds normal. She has no wheezes. She has no rales.  Abdominal: Soft. Bowel sounds are normal. She exhibits no distension and no mass. There is no tenderness. There is no rebound and no guarding.  Musculoskeletal: Normal range of motion. She exhibits no edema.  Lymphadenopathy:    She has no cervical adenopathy.  Neurological: She is alert and oriented to person, place, and time. No cranial nerve deficit. She exhibits abnormal muscle tone. Coordination abnormal.  Weakness of both RUE and RLE  Skin: Skin is warm and dry. No rash noted.  Psychiatric: Mood, memory, affect and judgment normal.  Nursing note and vitals reviewed.    LABORATORY DATA:  I have reviewed the data as listed Lab  Results  Component Value Date   WBC 6.1 04/07/2016   HGB 13.9 04/07/2016   HCT 38.9 04/07/2016   MCV 86.1 04/07/2016   PLT 234 04/07/2016   CMP     Component Value Date/Time   NA 140 03/17/2016 0931   K 3.4 (L) 03/17/2016 0931   CL 106 03/17/2016 0931   CO2 28 03/17/2016 0931   GLUCOSE 121 (H) 03/17/2016 0931   BUN 12 03/17/2016 0931   CREATININE 0.86 03/17/2016 0931   CALCIUM 8.8 (L) 03/17/2016 0931   PROT 7.3 03/17/2016 0931   ALBUMIN 3.8 03/17/2016 0931   AST 17 03/17/2016 0931   ALT 18 03/17/2016 0931   ALKPHOS 105 03/17/2016 0931   BILITOT 0.7 03/17/2016 0931   GFRNONAA >60 03/17/2016 0931   GFRAA >60 03/17/2016 0931     RADIOGRAPHIC STUDIES: I have personally reviewed the radiological images as listed and agreed with the findings in the report. No results found.  Study Result   CLINICAL DATA:  Status post central venous catheter placement  EXAM: PORTABLE CHEST 1 VIEW  COMPARISON:  01/30/2016  FINDINGS: There is a right chest wall port a catheter with tip in the projection of the SVC. There is no pneumothorax identified after port placement. Normal heart size. No pleural effusion or edema identified. No airspace consolidation.  IMPRESSION: 1. No complications following Port-A-Cath placement.   Electronically Signed   By: Kerby Moors M.D.   On: 02/14/2016 10:54     PATHOLOGY:            ASSESSMENT & PLAN:  Multifocal carcinoma L breast ER+ PR+ ( 5%, 100%) HER 2 -  Carcinoma of the upper outer quadrant of the L breast Carcinoma of the upper inner quadrant of the L breast pT2pN1 High Risk Mammaprint HX CVA with R sided weakness Hysterectomy Tobacco Use Hypokalemia  She is currently on CMF and doing well. After a length discussion (once her mammaprint results were available) she opted for therapy and we decided on CMF for tolerance. I will refer her to XRT in [place] at the completion of chemotherapy given her node positivity.  She will require endocrine therapy.  She should continue her aspirin and plavix.  Patient is here for Cycle 3 adjuvant CMF today. Will proceed as planned  Labs reviewed. Results are noted above.   She is doing well with treatment. Weight is relatively stable.   She is encouraged to continue on her potassium as prescribed.   She will return for follow up with her next treatment cycle.   Orders Placed This Encounter  Procedures  . CBC with Differential    Standing Status:   Future    Standing Expiration Date:   04/07/2017  . Comprehensive metabolic panel    Standing Status:   Future    Standing Expiration Date:   04/07/2017    All questions were answered. The patient knows to call the clinic with any problems, questions or concerns.  This document serves as a record of services personally performed by Ancil Linsey, MD. It was created on her behalf by Arlyce Harman, a trained medical scribe. The creation of this record is based on the scribe's personal observations and the provider's statements to them. This document has been checked and approved by the attending provider.  I have reviewed the above documentation for accuracy and completeness, and I agree with the above.  This note was electronically signed.    Molli Hazard, MD  04/07/2016 9:10 AM

## 2016-04-07 NOTE — Patient Instructions (Signed)
[place] at [facility] Discharge Instructions  RECOMMENDATIONS MADE BY THE CONSULTANT AND ANY TEST RESULTS WILL BE SENT TO YOUR REFERRING PHYSICIAN.  You saw Dr.Penland today. Follow up in 3 weeks, same day as chemo with labs. See Amy at checkout for appointments.  Thank you for choosing [place] at [facility] to provide your oncology and hematology care.  To afford each patient quality time with our provider, please arrive at least 15 minutes before your scheduled appointment time.   Beginning January 23rd 2017 lab work for the [facility] will be done in the  Main lab at [facility] on 1st floor. If you have a lab appointment with the [facility] please come in thru the  Main Entrance and check in at the main information desk  You need to re-schedule your appointment should you arrive 10 or more minutes late.  We strive to give you quality time with our providers, and arriving late affects you and other patients whose appointments are after yours.  Also, if you no show three or more times for appointments you may be dismissed from the clinic at the providers discretion.     Again, thank you for choosing [facility].  Our hope is that these requests will decrease the amount of time that you wait before being seen by our physicians.       _____________________________________________________________  Should you have questions after your visit to [facility], please contact our office at [phone] between the hours of 8:30 a.m. and 4:30 p.m.  Voicemails left after 4:30 p.m. will not be returned until the following business day.  For prescription refill requests, have your pharmacy contact our office.         Resources For Cancer Patients and their Caregivers ? American Cancer Society: Can assist with transportation, wigs, general needs, runs Look Good Feel Better.        [phone] ? Cancer  Care: Provides financial assistance, online support groups, medication/co-pay assistance.  1-800-813-HOPE 317-466-1944) ? [place] Assists [place] cancer patients and their families through emotional , educational and financial support.  ([phone] ? [place] DSS Where to apply for food stamps, Medicaid and utility assistance. 732-422-4385 ? RCATS: Transportation to medical appointments. 769-315-4262 ? Social Security Administration: May apply for disability if have a Stage IV cancer. 724-086-2317 [phone])[phone] ? [employer], Disability and Transit Services: Assists with nutrition, care and transit needs. [place] Support Programs: @10RELATIVEDAYS @ > Cancer Support Group  2nd Tuesday of the month 1pm-2pm, Journey Room  > Creative Journey  3rd Tuesday of the month 1130am-1pm, Journey Room  > Look Good Feel Better  1st Wednesday of the month 10am-12 noon, Journey Room (Call [place] to register [phone])

## 2016-04-08 ENCOUNTER — Other Ambulatory Visit (HOSPITAL_COMMUNITY): Payer: Self-pay | Admitting: Emergency Medicine

## 2016-04-09 ENCOUNTER — Encounter (HOSPITAL_BASED_OUTPATIENT_CLINIC_OR_DEPARTMENT_OTHER): Payer: Medicare HMO

## 2016-04-09 VITALS — BP 161/75 | HR 52 | Temp 97.9°F | Resp 18

## 2016-04-09 DIAGNOSIS — C773 Secondary and unspecified malignant neoplasm of axilla and upper limb lymph nodes: Secondary | ICD-10-CM

## 2016-04-09 DIAGNOSIS — C50212 Malignant neoplasm of upper-inner quadrant of left female breast: Secondary | ICD-10-CM

## 2016-04-09 DIAGNOSIS — C50412 Malignant neoplasm of upper-outer quadrant of left female breast: Secondary | ICD-10-CM

## 2016-04-09 DIAGNOSIS — Z5189 Encounter for other specified aftercare: Secondary | ICD-10-CM

## 2016-04-09 DIAGNOSIS — C50912 Malignant neoplasm of unspecified site of left female breast: Secondary | ICD-10-CM

## 2016-04-09 MED ORDER — PEGFILGRASTIM INJECTION 6 MG/0.6ML ~~LOC~~
PREFILLED_SYRINGE | SUBCUTANEOUS | Status: AC
  Filled 2016-04-09: qty 0.6

## 2016-04-09 MED ORDER — PEGFILGRASTIM INJECTION 6 MG/0.6ML ~~LOC~~
6.0000 mg | PREFILLED_SYRINGE | Freq: Once | SUBCUTANEOUS | Status: AC
  Administered 2016-04-09: 6 mg via SUBCUTANEOUS

## 2016-04-09 NOTE — Patient Instructions (Signed)
[place] at [facility] Discharge Instructions  RECOMMENDATIONS MADE BY THE CONSULTANT AND ANY TEST RESULTS WILL BE SENT TO YOUR REFERRING PHYSICIAN.  Received Neulasta today. Follow-up as scheduled. Call clinic for any questions or concerns  Thank you for choosing [place] at [facility] to provide your oncology and hematology care.  To afford each patient quality time with our provider, please arrive at least 15 minutes before your scheduled appointment time.   Beginning January 23rd 2017 lab work for the [facility] will be done in the  Main lab at [facility] on 1st floor. If you have a lab appointment with the [place] please come in thru the  Main Entrance and check in at the main information desk  You need to re-schedule your appointment should you arrive 10 or more minutes late.  We strive to give you quality time with our providers, and arriving late affects you and other patients whose appointments are after yours.  Also, if you no show three or more times for appointments you may be dismissed from the clinic at the providers discretion.     Again, thank you for choosing [facility].  Our hope is that these requests will decrease the amount of time that you wait before being seen by our physicians.       _____________________________________________________________  Should you have questions after your visit to [facility], please contact our office at [phone] between the hours of 8:30 a.m. and 4:30 p.m.  Voicemails left after 4:30 p.m. will not be returned until the following business day.  For prescription refill requests, have your pharmacy contact our office.         Resources For Cancer Patients and their Caregivers ? American Cancer Society: Can assist with transportation, wigs, general needs, runs Look Good Feel Better.        ([phone] ? Cancer Care: Provides financial  assistance, online support groups, medication/co-pay assistance.  1-800-813-HOPE 8163371630) ? [place] Assists [place] cancer patients and their families through emotional , educational and financial support.  912-821-7224 ? [place] DSS Where to apply for food stamps, Medicaid and utility assistance. [phone] ? [facility]: Transportation to medical appointments. 858-501-0374 ? Social Security Administration: May apply for disability if have a Stage IV cancer. [phone] 6576638548 ? [employer], Disability and Transit Services: Assists with nutrition, care and transit needs. [place] Support Programs: @10RELATIVEDAYS @ > Cancer Support Group  2nd Tuesday of the month 1pm-2pm, Journey Room  > Creative Journey  3rd Tuesday of the month 1130am-1pm, Journey Room  > Look Good Feel Better  1st Wednesday of the month 10am-12 noon, Journey Room (Call [place] to register [phone])

## 2016-04-09 NOTE — Progress Notes (Signed)
Julie Parker tolerated Neulasta injection well without complaints or incident. VSS Pt discharged via wheelchair in satisfactory condition with [facility]

## 2016-04-23 ENCOUNTER — Telehealth (HOSPITAL_COMMUNITY): Payer: Self-pay | Admitting: Emergency Medicine

## 2016-04-23 NOTE — Telephone Encounter (Signed)
Pt called and said that she had missed her appt but she did not have an appt. Today.  I told her she has an appt 05/01/2016 and is suppose to be here at 9:10 to see Gershon Mussel then have chemotherapy.  She verbalized understanding.

## 2016-04-25 ENCOUNTER — Ambulatory Visit (HOSPITAL_COMMUNITY): Payer: Medicare HMO | Admitting: Oncology

## 2016-04-25 ENCOUNTER — Ambulatory Visit (HOSPITAL_COMMUNITY): Payer: Medicare HMO

## 2016-04-29 ENCOUNTER — Ambulatory Visit (HOSPITAL_COMMUNITY): Payer: Medicare HMO

## 2016-04-29 ENCOUNTER — Ambulatory Visit (HOSPITAL_COMMUNITY): Payer: Medicare HMO | Admitting: Oncology

## 2016-05-01 ENCOUNTER — Encounter (HOSPITAL_BASED_OUTPATIENT_CLINIC_OR_DEPARTMENT_OTHER): Payer: Medicare HMO

## 2016-05-01 ENCOUNTER — Ambulatory Visit (HOSPITAL_COMMUNITY): Payer: Medicare HMO | Admitting: Oncology

## 2016-05-01 ENCOUNTER — Ambulatory Visit (HOSPITAL_COMMUNITY): Payer: Medicare HMO

## 2016-05-01 VITALS — BP 174/74 | HR 62 | Temp 98.1°F | Resp 20 | Wt 202.8 lb

## 2016-05-01 DIAGNOSIS — Z5111 Encounter for antineoplastic chemotherapy: Secondary | ICD-10-CM | POA: Diagnosis not present

## 2016-05-01 DIAGNOSIS — C50212 Malignant neoplasm of upper-inner quadrant of left female breast: Secondary | ICD-10-CM | POA: Diagnosis not present

## 2016-05-01 DIAGNOSIS — C50412 Malignant neoplasm of upper-outer quadrant of left female breast: Secondary | ICD-10-CM | POA: Diagnosis not present

## 2016-05-01 DIAGNOSIS — C50912 Malignant neoplasm of unspecified site of left female breast: Secondary | ICD-10-CM

## 2016-05-01 LAB — COMPREHENSIVE METABOLIC PANEL
ALBUMIN: 3.7 g/dL (ref 3.5–5.0)
ALT: 19 U/L (ref 14–54)
ANION GAP: 8 (ref 5–15)
AST: 20 U/L (ref 15–41)
Alkaline Phosphatase: 112 U/L (ref 38–126)
BUN: 12 mg/dL (ref 6–20)
CHLORIDE: 103 mmol/L (ref 101–111)
CO2: 26 mmol/L (ref 22–32)
Calcium: 8.8 mg/dL — ABNORMAL LOW (ref 8.9–10.3)
Creatinine, Ser: 0.91 mg/dL (ref 0.44–1.00)
GFR calc Af Amer: >60 mL/min (ref >60)
Glucose, Bld: 140 mg/dL — ABNORMAL HIGH (ref 65–99)
POTASSIUM: 3.2 mmol/L — AB (ref 3.5–5.1)
Sodium: 137 mmol/L (ref 135–145)
Total Bilirubin: 0.7 mg/dL (ref 0.3–1.2)
Total Protein: 7.1 g/dL (ref 6.5–8.1)

## 2016-05-01 LAB — CBC WITH DIFFERENTIAL/PLATELET
BASOS ABS: 0.1 10*3/uL (ref 0.0–0.1)
BASOS PCT: 1 %
EOS PCT: 3 %
Eosinophils Absolute: 0.2 10*3/uL (ref 0.0–0.7)
HCT: 39.3 % (ref 36.0–46.0)
Hemoglobin: 14.1 g/dL (ref 12.0–15.0)
Lymphocytes Relative: 28 %
Lymphs Abs: 2 10*3/uL (ref 0.7–4.0)
MCH: 31.5 pg (ref 26.0–34.0)
MCHC: 35.9 g/dL (ref 30.0–36.0)
MCV: 87.7 fL (ref 78.0–100.0)
MONO ABS: 0.7 10*3/uL (ref 0.1–1.0)
MONOS PCT: 9 %
Neutro Abs: 4.3 10*3/uL (ref 1.7–7.7)
Neutrophils Relative %: 59 %
PLATELETS: 198 10*3/uL (ref 150–400)
RBC: 4.48 MIL/uL (ref 3.87–5.11)
RDW: 15.8 % — AB (ref 11.5–15.5)
WBC: 7.2 10*3/uL (ref 4.0–10.5)

## 2016-05-01 MED ORDER — SODIUM CHLORIDE 0.9 % IV SOLN
Freq: Once | INTRAVENOUS | Status: AC
  Administered 2016-05-01: 11:00:00 via INTRAVENOUS

## 2016-05-01 MED ORDER — HEPARIN SOD (PORK) LOCK FLUSH 100 UNIT/ML IV SOLN
500.0000 [IU] | Freq: Once | INTRAVENOUS | Status: AC | PRN
  Administered 2016-05-01: 500 [IU]
  Filled 2016-05-01: qty 5

## 2016-05-01 MED ORDER — DEXAMETHASONE SODIUM PHOSPHATE 10 MG/ML IJ SOLN
10.0000 mg | Freq: Once | INTRAMUSCULAR | Status: AC
  Administered 2016-05-01: 10 mg via INTRAVENOUS
  Filled 2016-05-01: qty 1

## 2016-05-01 MED ORDER — FLUOROURACIL CHEMO INJECTION 2.5 GM/50ML
600.0000 mg/m2 | Freq: Once | INTRAVENOUS | Status: AC
  Administered 2016-05-01: 1250 mg via INTRAVENOUS
  Filled 2016-05-01: qty 25

## 2016-05-01 MED ORDER — SODIUM CHLORIDE 0.9% FLUSH
10.0000 mL | INTRAVENOUS | Status: DC | PRN
  Administered 2016-05-01: 10 mL
  Filled 2016-05-01: qty 10

## 2016-05-01 MED ORDER — METHOTREXATE SODIUM (PF) CHEMO INJECTION 250 MG/10ML
40.0000 mg/m2 | Freq: Once | INTRAMUSCULAR | Status: AC
  Administered 2016-05-01: 82 mg via INTRAVENOUS
  Filled 2016-05-01: qty 3.28

## 2016-05-01 MED ORDER — CYCLOPHOSPHAMIDE CHEMO INJECTION 1 GM
600.0000 mg/m2 | Freq: Once | INTRAMUSCULAR | Status: AC
  Administered 2016-05-01: 1240 mg via INTRAVENOUS
  Filled 2016-05-01: qty 50

## 2016-05-01 MED ORDER — PALONOSETRON HCL INJECTION 0.25 MG/5ML
0.2500 mg | Freq: Once | INTRAVENOUS | Status: AC
  Administered 2016-05-01: 0.25 mg via INTRAVENOUS
  Filled 2016-05-01: qty 5

## 2016-05-01 NOTE — Progress Notes (Signed)
Julie Parker tolerated chemo tx well without complaints or incident. Labs reviewed prior to administering chemotherapy.VSS upon discharge. Pt discharged via wheelchair in stable condition. [facility] providing transportation

## 2016-05-01 NOTE — Progress Notes (Deleted)
Pcp Not In System No address on file  No diagnosis found.  CURRENT THERAPY: Starting CMF today, 02/22/2016  INTERVAL HISTORY: Julie Parker 60 y.o. female returns for followup of Stage IIB (T2(2)N1M0) invasive ductal adenocarcinoma of left breat with multifocal disease, ER/PR+, HER2 NEGATIVE, and 1/1 sentinel lymph node positive for disease.  Mammaprint has been sent and noted to be high risk.  Now on adjuvant therapy with CMF.   Oncology History   Multifocal L breast cancer     Cancer of left breast, stage 2 ([place])   07/04/2015 Mammogram    Digital mammography reveals 3 abnormal areas in the left breast, 2:00 position, 1.6 x 1.4 cm palpable mass, 10:00 position 1.7 x 1.4 cm palpable mass, upper outer quadrant 1.5-1.1 pleomorphic calcifications        07/04/2015 Initial Biopsy    Biopsies reveals invasive ductal carcinoma at the 2:00 position in the 10:00 position, ER positive greater than 90%, PR +1-5%, HER-2 negative Ki-67 marker 26%, the second tumor greater than 90% ER positive, 80% PR positive, HER-2 negative, Ki-67 marker 24%          08/17/2015 Imaging    MRI breast: 1.4 cm area of nodular and linear enhancement in the upper inner quadrant of the LEFT breast suspicious for ductal carcinoma in situ and possible invasive ductal carcinoma. There are no findings suspicious for malignancy in the right breast. 2.1 cm biopsy-proven invasive ductal carcinoma in the 10 o'clock position of the left breast. 1.7 cm biopsy-proven invasive ductal carcinoma in the 2 o'clock position of the left breast       12/13/2015 Initial Diagnosis    Breast cancer, left ([place])     12/13/2015 Surgery    Multifocal L breast cancer, L simple mastectomy with L sentinel LN mapping      12/18/2015 Pathology Results    Invasive ductal adenocarcinoma, grade 3, spanning 2.6 cm and 1.9 cm. Ductal carcinoma in situ, grade 3. Lymphovascular invasion identified. Margins of resection were negative for  carcinoma. 1/1 metastatic ductal adenocarcinoma in left axillary sentinel lymph node biopsy.      12/18/2015 Pathology Results    ER 100% positive, PR 100% positive, KI-67 15%, HER-2 negative.      02/22/2016 -  Chemotherapy    The patient had palonosetron (ALOXI) injection 0.25 mg, 0.25 mg, Intravenous,  Once, 1 of 6 cycles  methotrexate (PF) chemo injection 82 mg, 40 mg/m2 = 82 mg, Intravenous,  Once, 1 of 6 cycles  pegfilgrastim (NEULASTA ONPRO KIT) injection 6 mg, 6 mg, Subcutaneous, Once, 1 of 6 cycles  cyclophosphamide (CYTOXAN) 1,240 mg in sodium chloride 0.9 % 250 mL chemo infusion, 600 mg/m2 = 1,240 mg, Intravenous,  Once, 1 of 6 cycles  fluorouracil (ADRUCIL) chemo injection 1,250 mg, 600 mg/m2 = 1,250 mg, Intravenous,  Once, 1 of 6 cycles  for chemotherapy treatment.         She reports ongoing tolerance to treatment.  She denies any nausea, vomiting, decreased appetite, weight loss, fevers, chills, change in bowel habits.    ROS  Past Medical History:  Diagnosis Date  . Arthritis   . Cancer [facility])    left breast cancer  . Cancer of left breast, stage 2 ([place]) 12/13/2015  . Hypertension   . Pneumonia   . Stroke [facility]    2 strokes, right side weakness    Past Surgical History:  Procedure Laterality Date  . ABDOMINAL HYSTERECTOMY    .  FOOT SURGERY Right   . PORTACATH PLACEMENT Right 02/14/2016   Procedure: INSERTION PORT-A-CATH;  Surgeon: Vickie Epley, MD;  Location: [facility];  Service: Vascular;  Laterality: Right;  . SIMPLE MASTECTOMY WITH AXILLARY SENTINEL NODE BIOPSY Left 12/13/2015   Procedure: LEFT SIMPLE MASTECTOMY WITH AXILLARY SENTINEL NODE BIOPSY;  Surgeon: Erroll Luna, MD;  Location: [facility];  Service: General;  Laterality: Left;  . TUBAL LIGATION      Family History  Problem Relation Age of Onset  . Hypertension Mother   . Diabetes Mother   . Other Father     sleeping disease    Social History   Social History  . Marital status: Widowed      Spouse name: N/A  . Number of children: N/A  . Years of education: N/A   Social History Main Topics  . Smoking status: Current Every Day Smoker    Packs/day: 0.25    Years: 20.00    Types: Cigarettes  . Smokeless tobacco: Never Used  . Alcohol use No  . Drug use: No  . Sexual activity: Not Currently    Birth control/ protection: Surgical   Other Topics Concern  . Not on file   Social History Narrative  . No narrative on file     PHYSICAL EXAMINATION  ECOG PERFORMANCE STATUS: 1 - Symptomatic but completely ambulatory  There were no vitals filed for this visit.   GENERAL:alert, no distress, well nourished, well developed, comfortable, cooperative, obese, smiling and Unaccompanied, in chemo-recliner. SKIN: skin color, texture, turgor are normal, no rashes or significant lesions HEAD: Normocephalic, No masses, lesions, tenderness or abnormalities EYES: normal, EOMI, Conjunctiva are pink and non-injected EARS: External ears normal OROPHARYNX:lips, buccal mucosa, and tongue normal and mucous membranes are moist  NECK: supple, trachea midline LYMPH:  not examined BREAST:not examined LUNGS: clear to auscultation  HEART: regular rate & rhythm ABDOMEN:abdomen soft and obese.  Normal bowel sounds x 4 quadrants. BACK: Back symmetric, no curvature. EXTREMITIES:less then 2 second capillary refill, no joint deformities, effusion, or inflammation, no skin discoloration, no cyanosis  NEURO: alert & oriented x 3 with fluent speech, no focal motor/sensory deficits   LABORATORY DATA: CBC    Component Value Date/Time   WBC 6.1 04/07/2016 0850   RBC 4.52 04/07/2016 0850   HGB 13.9 04/07/2016 0850   HCT 38.9 04/07/2016 0850   PLT 234 04/07/2016 0850   MCV 86.1 04/07/2016 0850   MCH 30.8 04/07/2016 0850   MCHC 35.7 04/07/2016 0850   RDW 15.3 04/07/2016 0850   LYMPHSABS 2.1 04/07/2016 0850   MONOABS 0.6 04/07/2016 0850   EOSABS 0.2 04/07/2016 0850   BASOSABS 0.1 04/07/2016  0850      Chemistry      Component Value Date/Time   NA 140 04/07/2016 0850   K 3.5 04/07/2016 0850   CL 106 04/07/2016 0850   CO2 27 04/07/2016 0850   BUN 16 04/07/2016 0850   CREATININE 0.89 04/07/2016 0850      Component Value Date/Time   CALCIUM 8.8 (L) 04/07/2016 0850   ALKPHOS 121 04/07/2016 0850   AST 16 04/07/2016 0850   ALT 18 04/07/2016 0850   BILITOT 0.6 04/07/2016 0850        PENDING LABS:   RADIOGRAPHIC STUDIES:  No results found.   PATHOLOGY:    ASSESSMENT AND PLAN:  No problem-specific Assessment & Plan notes found for this encounter.   ORDERS PLACED FOR THIS ENCOUNTER: No orders of the defined types  were placed in this encounter.   MEDICATIONS PRESCRIBED THIS ENCOUNTER: No orders of the defined types were placed in this encounter.   THERAPY PLAN:  CMF as planned followed by XRT and anti-endocrine therapy.  All questions were answered. The patient knows to call the clinic with any problems, questions or concerns. We can certainly see the patient much sooner if necessary.  Patient and plan discussed with Dr. Ancil Linsey and she is in agreement with the aforementioned.   This note is electronically signed by: Doy Mince 05/01/2016 7:47 AM

## 2016-05-01 NOTE — Patient Instructions (Signed)
[facility] Discharge Instructions for Patients Receiving Chemotherapy   Beginning January 23rd 2017 lab work for the [facility] will be done in the  Main lab at [facility] on 1st floor. If you have a lab appointment with the [place] please come in thru the  Main Entrance and check in at the main information desk   Today you received the following chemotherapy agents Cytoxan,Methotrexate and 5FU. Follow-up as scheduled. Call clinic for any questions or concerns  To help prevent nausea and vomiting after your treatment, we encourage you to take your nausea medication   If you develop nausea and vomiting, or diarrhea that is not controlled by your medication, call the clinic.  The clinic phone number is [phone]. Office hours are Monday-Friday 8:30am-5:00pm.  BELOW ARE SYMPTOMS THAT SHOULD BE REPORTED IMMEDIATELY:  *FEVER GREATER THAN 101.0 F  *CHILLS WITH OR WITHOUT FEVER  NAUSEA AND VOMITING THAT IS NOT CONTROLLED WITH YOUR NAUSEA MEDICATION  *UNUSUAL SHORTNESS OF BREATH  *UNUSUAL BRUISING OR BLEEDING  TENDERNESS IN MOUTH AND THROAT WITH OR WITHOUT PRESENCE OF ULCERS  *URINARY PROBLEMS  *BOWEL PROBLEMS  UNUSUAL RASH Items with * indicate a potential emergency and should be followed up as soon as possible. If you have an emergency after office hours please contact your primary care physician or go to the nearest emergency department.  Please call the clinic during office hours if you have any questions or concerns.   You may also contact the Patient Navigator at [phone] should you have any questions or need assistance in obtaining follow up care.      Resources For Cancer Patients and their Caregivers ? American Cancer Society: Can assist with transportation, wigs, general needs, runs Look Good Feel Better.        907-102-5676 ? Cancer Care: Provides financial assistance, online support groups, medication/co-pay assistance.   1-800-813-HOPE 450-468-1447) ? [place] Assists [place] cancer patients and their families through emotional , educational and financial support.  ([phone] ? [place] DSS Where to apply for food stamps, Medicaid and utility assistance. ([phone] 090-9091 ? [facility]: Transportation to medical appointments. 463-495-8094 ? Social Security Administration: May apply for disability if have a Stage IV cancer. [phone] 770-157-9342 ? [employer], Disability and Transit Services: Assists with nutrition, care and transit needs. [phone]

## 2016-05-01 NOTE — Assessment & Plan Note (Deleted)
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  Mammaprint has been sent and noted to be high risk.  Now on adjuvant therapy with CMF.  Oncology history is updated.  Pre-treatment labs today: CBC diff, CMET.  I personally reviewed and went over laboratory results with the patient.  The results are noted within this dictation.   Return in 3 weeks for follow-up and cycle #5 of treatment.

## 2016-05-02 ENCOUNTER — Encounter (HOSPITAL_BASED_OUTPATIENT_CLINIC_OR_DEPARTMENT_OTHER): Payer: Medicare HMO

## 2016-05-02 VITALS — BP 166/92 | HR 63 | Temp 97.4°F | Resp 18

## 2016-05-02 DIAGNOSIS — C50912 Malignant neoplasm of unspecified site of left female breast: Secondary | ICD-10-CM

## 2016-05-02 DIAGNOSIS — C773 Secondary and unspecified malignant neoplasm of axilla and upper limb lymph nodes: Secondary | ICD-10-CM | POA: Diagnosis not present

## 2016-05-02 DIAGNOSIS — C50412 Malignant neoplasm of upper-outer quadrant of left female breast: Secondary | ICD-10-CM | POA: Diagnosis not present

## 2016-05-02 DIAGNOSIS — C50212 Malignant neoplasm of upper-inner quadrant of left female breast: Secondary | ICD-10-CM

## 2016-05-02 MED ORDER — PEGFILGRASTIM INJECTION 6 MG/0.6ML ~~LOC~~
6.0000 mg | PREFILLED_SYRINGE | Freq: Once | SUBCUTANEOUS | Status: AC
  Administered 2016-05-02: 6 mg via SUBCUTANEOUS

## 2016-05-02 MED ORDER — PEGFILGRASTIM INJECTION 6 MG/0.6ML ~~LOC~~
PREFILLED_SYRINGE | SUBCUTANEOUS | Status: AC
  Filled 2016-05-02: qty 0.6

## 2016-05-02 NOTE — Patient Instructions (Signed)
[facility] at [facility] Discharge Instructions  RECOMMENDATIONS MADE BY THE CONSULTANT AND ANY TEST RESULTS WILL BE SENT TO YOUR REFERRING PHYSICIAN.  Received Neulasta injection today. Follow-up as scheduled. Call clinic for any questions or concerns  Thank you for choosing [facility] at [facility] to provide your oncology and hematology care.  To afford each patient quality time with our provider, please arrive at least 15 minutes before your scheduled appointment time.    If you have a lab appointment with the Cancer Center please come in thru the  Main Entrance and check in at the main information desk  You need to re-schedule your appointment should you arrive 10 or more minutes late.  We strive to give you quality time with our providers, and arriving late affects you and other patients whose appointments are after yours.  Also, if you no show three or more times for appointments you may be dismissed from the clinic at the providers discretion.     Again, thank you for choosing [facility].  Our hope is that these requests will decrease the amount of time that you wait before being seen by our physicians.       _____________________________________________________________  Should you have questions after your visit to [facility], please contact our office at [phone] between the hours of 8:30 a.m. and 4:30 p.m.  Voicemails left after 4:30 p.m. will not be returned until the following business day.  For prescription refill requests, have your pharmacy contact our office.       Resources For Cancer Patients and their Caregivers ? American Cancer Society: Can assist with transportation, wigs, general needs, runs Look Good Feel Better.        1-888-227-6333 ? Cancer Care: Provides financial assistance, online support groups, medication/co-pay assistance.  1-800-813-HOPE (4673) ? [facility] Assists [place] cancer patients and their families through emotional , educational and financial support.  336-427-4357 ? [place] DSS Where to apply for food stamps, Medicaid and utility assistance. 336-342-1394 ? [facility]: Transportation to medical appointments. 336-347-2287 ? Social Security Administration: May apply for disability if have a Stage IV cancer. 336-342-7796 1-800-772-1213 ? [place] A[employer], Disability and Transit Services: Assists with nutrition, care and transit needs. 336-349-2343  Cancer Center Support Programs: @10RELATIVEDAYS@ > Cancer Support Group  2nd Tuesday of the month 1pm-2pm, Journey Room  > Creative Journey  3rd Tuesday of the month 1130am-1pm, Journey Room  > Look Good Feel Better  1st Wednesday of the month 10am-12 noon, Journey Room (Call [employer] to register 1-800-395-5775)   

## 2016-05-02 NOTE — Progress Notes (Signed)
Julie Parker Stopper tolerated Neulasta injection well without complaints or incident. VSS. Pt discharged via wheelchair in stable condition.[facility] transported pt home

## 2016-05-07 DIAGNOSIS — E7800 Pure hypercholesterolemia, unspecified: Secondary | ICD-10-CM | POA: Diagnosis not present

## 2016-05-07 DIAGNOSIS — I639 Cerebral infarction, unspecified: Secondary | ICD-10-CM | POA: Diagnosis not present

## 2016-05-07 DIAGNOSIS — I10 Essential (primary) hypertension: Secondary | ICD-10-CM | POA: Diagnosis not present

## 2016-05-12 ENCOUNTER — Other Ambulatory Visit (HOSPITAL_COMMUNITY): Payer: Self-pay | Admitting: *Deleted

## 2016-05-12 MED ORDER — POTASSIUM CHLORIDE ER 10 MEQ PO TBCR: 10.0000 meq | EXTENDED_RELEASE_TABLET | Freq: Three times a day (TID) | ORAL | 3 refills | Status: AC

## 2016-05-16 ENCOUNTER — Ambulatory Visit (HOSPITAL_COMMUNITY): Payer: Medicare HMO

## 2016-05-16 ENCOUNTER — Ambulatory Visit (HOSPITAL_COMMUNITY): Payer: Medicare HMO | Admitting: Hematology & Oncology

## 2016-05-19 ENCOUNTER — Ambulatory Visit (HOSPITAL_COMMUNITY): Payer: Medicare HMO | Admitting: Hematology & Oncology

## 2016-05-19 ENCOUNTER — Ambulatory Visit (HOSPITAL_COMMUNITY): Payer: Medicare HMO

## 2016-05-21 ENCOUNTER — Ambulatory Visit (HOSPITAL_COMMUNITY): Payer: Medicare HMO | Admitting: Hematology & Oncology

## 2016-05-21 ENCOUNTER — Ambulatory Visit (HOSPITAL_COMMUNITY): Payer: Medicare HMO

## 2016-05-23 ENCOUNTER — Ambulatory Visit (HOSPITAL_COMMUNITY): Payer: Medicare HMO

## 2016-05-26 ENCOUNTER — Ambulatory Visit (HOSPITAL_COMMUNITY): Payer: Medicare HMO

## 2016-05-28 ENCOUNTER — Encounter (HOSPITAL_BASED_OUTPATIENT_CLINIC_OR_DEPARTMENT_OTHER): Payer: PPO | Admitting: Adult Health

## 2016-05-28 ENCOUNTER — Encounter (HOSPITAL_COMMUNITY): Payer: PPO | Attending: Hematology & Oncology

## 2016-05-28 ENCOUNTER — Encounter (HOSPITAL_COMMUNITY): Payer: Self-pay

## 2016-05-28 ENCOUNTER — Ambulatory Visit (HOSPITAL_COMMUNITY): Payer: Medicare HMO

## 2016-05-28 VITALS — BP 166/71 | HR 60 | Temp 98.3°F | Resp 16 | Wt 204.0 lb

## 2016-05-28 DIAGNOSIS — Z720 Tobacco use: Secondary | ICD-10-CM

## 2016-05-28 DIAGNOSIS — E876 Hypokalemia: Secondary | ICD-10-CM

## 2016-05-28 DIAGNOSIS — Z5111 Encounter for antineoplastic chemotherapy: Secondary | ICD-10-CM | POA: Diagnosis not present

## 2016-05-28 DIAGNOSIS — C50912 Malignant neoplasm of unspecified site of left female breast: Secondary | ICD-10-CM | POA: Diagnosis not present

## 2016-05-28 DIAGNOSIS — Z8673 Personal history of transient ischemic attack (TIA), and cerebral infarction without residual deficits: Secondary | ICD-10-CM | POA: Diagnosis not present

## 2016-05-28 LAB — COMPREHENSIVE METABOLIC PANEL
ALBUMIN: 3.6 g/dL (ref 3.5–5.0)
ALT: 16 U/L (ref 14–54)
ANION GAP: 8 (ref 5–15)
AST: 15 U/L (ref 15–41)
Alkaline Phosphatase: 113 U/L (ref 38–126)
BILIRUBIN TOTAL: 0.5 mg/dL (ref 0.3–1.2)
BUN: 13 mg/dL (ref 6–20)
CO2: 26 mmol/L (ref 22–32)
Calcium: 9.1 mg/dL (ref 8.9–10.3)
Chloride: 103 mmol/L (ref 101–111)
Creatinine, Ser: 0.88 mg/dL (ref 0.44–1.00)
GFR calc non Af Amer: >60 mL/min (ref >60)
GLUCOSE: 162 mg/dL — AB (ref 65–99)
Potassium: 3.2 mmol/L — ABNORMAL LOW (ref 3.5–5.1)
Sodium: 137 mmol/L (ref 135–145)
Total Protein: 7.3 g/dL (ref 6.5–8.1)

## 2016-05-28 LAB — CBC WITH DIFFERENTIAL/PLATELET
Basophils Absolute: 0.1 10*3/uL (ref 0.0–0.1)
Basophils Relative: 1 %
Eosinophils Absolute: 0.1 10*3/uL (ref 0.0–0.7)
Eosinophils Relative: 2 %
HEMATOCRIT: 37.8 % (ref 36.0–46.0)
HEMOGLOBIN: 13.8 g/dL (ref 12.0–15.0)
LYMPHS PCT: 37 %
Lymphs Abs: 2 10*3/uL (ref 0.7–4.0)
MCH: 31.5 pg (ref 26.0–34.0)
MCHC: 36.5 g/dL — AB (ref 30.0–36.0)
MCV: 86.3 fL (ref 78.0–100.0)
MONOS PCT: 9 %
Monocytes Absolute: 0.5 10*3/uL (ref 0.1–1.0)
NEUTROS ABS: 2.8 10*3/uL (ref 1.7–7.7)
NEUTROS PCT: 51 %
Platelets: 204 10*3/uL (ref 150–400)
RBC: 4.38 MIL/uL (ref 3.87–5.11)
RDW: 15.1 % (ref 11.5–15.5)
WBC: 5.4 10*3/uL (ref 4.0–10.5)

## 2016-05-28 MED ORDER — FLUOROURACIL CHEMO INJECTION 2.5 GM/50ML
600.0000 mg/m2 | Freq: Once | INTRAVENOUS | Status: AC
  Administered 2016-05-28: 1250 mg via INTRAVENOUS
  Filled 2016-05-28: qty 25

## 2016-05-28 MED ORDER — SODIUM CHLORIDE 0.9% FLUSH
10.0000 mL | INTRAVENOUS | Status: DC | PRN
  Administered 2016-05-28: 10 mL
  Filled 2016-05-28: qty 10

## 2016-05-28 MED ORDER — HEPARIN SOD (PORK) LOCK FLUSH 100 UNIT/ML IV SOLN
500.0000 [IU] | Freq: Once | INTRAVENOUS | Status: AC | PRN
  Administered 2016-05-28: 500 [IU]
  Filled 2016-05-28: qty 5

## 2016-05-28 MED ORDER — POTASSIUM CHLORIDE CRYS ER 20 MEQ PO TBCR
40.0000 meq | EXTENDED_RELEASE_TABLET | Freq: Once | ORAL | Status: AC
  Administered 2016-05-28: 40 meq via ORAL
  Filled 2016-05-28: qty 2

## 2016-05-28 MED ORDER — SODIUM CHLORIDE 0.9 % IV SOLN
Freq: Once | INTRAVENOUS | Status: AC
  Administered 2016-05-28: 11:00:00 via INTRAVENOUS

## 2016-05-28 MED ORDER — SODIUM CHLORIDE 0.9 % IV SOLN
600.0000 mg/m2 | Freq: Once | INTRAVENOUS | Status: AC
  Administered 2016-05-28: 1240 mg via INTRAVENOUS
  Filled 2016-05-28: qty 12

## 2016-05-28 MED ORDER — METHOTREXATE SODIUM (PF) CHEMO INJECTION 250 MG/10ML
40.0000 mg/m2 | Freq: Once | INTRAMUSCULAR | Status: AC
  Administered 2016-05-28: 82 mg via INTRAVENOUS
  Filled 2016-05-28: qty 3.28

## 2016-05-28 MED ORDER — DEXAMETHASONE SODIUM PHOSPHATE 10 MG/ML IJ SOLN
10.0000 mg | Freq: Once | INTRAMUSCULAR | Status: AC
Start: 2016-05-28 — End: 2016-05-28
  Administered 2016-05-28: 10 mg via INTRAVENOUS
  Filled 2016-05-28: qty 1

## 2016-05-28 MED ORDER — POTASSIUM CHLORIDE 10 MEQ/100ML IV SOLN
10.0000 meq | INTRAVENOUS | Status: AC
  Administered 2016-05-28 (×3): 10 meq via INTRAVENOUS
  Filled 2016-05-28 (×3): qty 100

## 2016-05-28 MED ORDER — SODIUM CHLORIDE 0.9 % IV SOLN
INTRAVENOUS | Status: DC
  Administered 2016-05-28: 11:00:00 via INTRAVENOUS

## 2016-05-28 MED ORDER — PALONOSETRON HCL INJECTION 0.25 MG/5ML
0.2500 mg | Freq: Once | INTRAVENOUS | Status: AC
  Administered 2016-05-28: 0.25 mg via INTRAVENOUS
  Filled 2016-05-28: qty 5

## 2016-05-28 NOTE — Progress Notes (Signed)
[place]:  Medical Oncology/Hematology  PCP:  Pcp Not In System  REASON FOR VISIT:  Consideration for next cycle chemotherapy for Stage IIB multifocal left breast cancer, ER+/PR+/HER2-  CURRENT THERAPY:  CMF (Cyclophosphamide, Methotrexate, 5-FU), cycle #5 due today    BRIEF ONCOLOGIC HISTORY:  pT2pN1 multifocal carcinoma of the L breast, ER+ PR+ HER 2 -  Mastectomy HX CVA Tobacco Use High risk Mammaprint  Oncology History   Multifocal L breast cancer     Cancer of left breast, stage 2 ([place])   07/04/2015 Mammogram    Digital mammography reveals 3 abnormal areas in the left breast, 2:00 position, 1.6 x 1.4 cm palpable mass, 10:00 position 1.7 x 1.4 cm palpable mass, upper outer quadrant 1.5-1.1 pleomorphic calcifications        07/04/2015 Initial Biopsy    Biopsies reveals invasive ductal carcinoma at the 2:00 position in the 10:00 position, ER positive greater than 90%, PR +1-5%, HER-2 negative Ki-67 marker 26%, the second tumor greater than 90% ER positive, 80% PR positive, HER-2 negative, Ki-67 marker 24%          08/17/2015 Imaging    MRI breast: 1.4 cm area of nodular and linear enhancement in the upper inner quadrant of the LEFT breast suspicious for ductal carcinoma in situ and possible invasive ductal carcinoma. There are no findings suspicious for malignancy in the right breast. 2.1 cm biopsy-proven invasive ductal carcinoma in the 10 o'clock position of the left breast. 1.7 cm biopsy-proven invasive ductal carcinoma in the 2 o'clock position of the left breast       12/13/2015 Initial Diagnosis    Breast cancer, left ([place])     12/13/2015 Surgery    Multifocal L breast cancer, L simple mastectomy with L sentinel LN mapping      12/18/2015 Pathology Results    Invasive ductal adenocarcinoma, grade 3, spanning 2.6 cm and 1.9 cm. Ductal carcinoma in situ, grade 3. Lymphovascular invasion identified. Margins of resection were negative  for carcinoma. 1/1 metastatic ductal adenocarcinoma in left axillary sentinel lymph node biopsy.      12/18/2015 Pathology Results    ER 100% positive, PR 100% positive, KI-67 15%, HER-2 negative.      02/22/2016 -  Chemotherapy    The patient had palonosetron (ALOXI) injection 0.25 mg, 0.25 mg, Intravenous,  Once, 3 of 6 cycles  methotrexate (PF) chemo injection 82 mg, 40 mg/m2 = 82 mg, Intravenous,  Once, 3 of 6 cycles  pegfilgrastim (NEULASTA) injection 6 mg, 6 mg, Subcutaneous, Once, 3 of 6 cycles  pegfilgrastim (NEULASTA ONPRO KIT) injection 6 mg, 6 mg, Subcutaneous, Once, 2 of 2 cycles  cyclophosphamide (CYTOXAN) 1,240 mg in sodium chloride 0.9 % 250 mL chemo infusion, 600 mg/m2 = 1,240 mg, Intravenous,  Once, 3 of 6 cycles  fluorouracil (ADRUCIL) chemo injection 1,250 mg, 600 mg/m2 = 1,250 mg, Intravenous,  Once, 3 of 6 cycles  for chemotherapy treatment.         INTERVAL HISTORY:  Julie Parker 61 y.o. female is here for ongoing follow-up of multifocal carcinoma of the L breast. Disease is ER+ PR+ (100%, 5%) and HER 2 -.She has undergone left mastectomy with Dr. Brantley Stage. She had previously seen Dr. Tressie Stalker in consultation. Mammaprint noted to be high risk. Adjuvant chemotherapy initiated in 02/2016 with Cyclophosphamide, Methotrexate, & 5-FU (CMF); she presents today for consideration for cycle #5 of chemotherapy.   She is unaccompanied and is seen in treatment  chair. She is doing well and largely has no complaints at all.  Denies any mouth sores, N&V, diarrhea, constipation, dysuria, hematuria, melena, or frank blood in stools.   (R)-sided weakness and neurologic symptoms are largely unchanged and are chronic.  Denies shortness of breath, cough, or chest pain.  Denies headaches.  Her appetite and energy levels are reportedly "fine."  She feels ready for her next cycle of treatment; she is pleased she has not lost her hair.    MEDICAL HISTORY:  Past Medical History:    Diagnosis Date  . Arthritis   . Cancer [facility])    left breast cancer  . Cancer of left breast, stage 2 ([place]) 12/13/2015  . Hypertension   . Pneumonia   . Stroke [facility])    2 strokes, right side weakness    SURGICAL HISTORY: Past Surgical History:  Procedure Laterality Date  . ABDOMINAL HYSTERECTOMY    . FOOT SURGERY Right   . PORTACATH PLACEMENT Right 02/14/2016   Procedure: INSERTION PORT-A-CATH;  Surgeon: Vickie Epley, MD;  Location: [facility];  Service: Vascular;  Laterality: Right;  . SIMPLE MASTECTOMY WITH AXILLARY SENTINEL NODE BIOPSY Left 12/13/2015   Procedure: LEFT SIMPLE MASTECTOMY WITH AXILLARY SENTINEL NODE BIOPSY;  Surgeon: Erroll Luna, MD;  Location: [facility];  Service: General;  Laterality: Left;  . TUBAL LIGATION      SOCIAL HISTORY: Social History   Social History  . Marital status: Widowed    Spouse name: N/A  . Number of children: N/A  . Years of education: N/A   Occupational History  . Not on file.   Social History Main Topics  . Smoking status: Current Every Day Smoker    Packs/day: 0.25    Years: 20.00    Types: Cigarettes  . Smokeless tobacco: Never Used  . Alcohol use No  . Drug use: No  . Sexual activity: Not Currently    Birth control/ protection: Surgical   Other Topics Concern  . Not on file   Social History Narrative  . No narrative on file   She currently smokes but is trying to quit. She started smoking when she was about 21. She smokes 4-5 packs a day.  She does not drink alcohol.  She enjoys playing sports, playing spades, watching tv. She does not work because she is disabled. She has four brother and three sisters.   FAMILY HISTORY: Family History  Problem Relation Age of Onset  . Hypertension Mother   . Diabetes Mother   . Other Father     sleeping disease   Dad passed away in his sleep around age 66 years old.  Mom is still living. She is about 37 years old. Mother ([name])   ALLERGIES:   Allergies   Allergen Reactions  . No Known Allergies Other (See Comments)     MEDICATIONS:  Current Outpatient Prescriptions  Medication Sig Dispense Refill  . aspirin 81 MG EC tablet Take 1 tablet by mouth daily.    Marland Kitchen atenolol (TENORMIN) 50 MG tablet Take 1 tablet by mouth daily.    . baclofen (LIORESAL) 10 MG tablet Take 1 tablet by mouth 3 (three) times daily as needed for muscle spasms.     . benazepril (LOTENSIN) 40 MG tablet     . chlorthalidone (HYGROTON) 25 MG tablet Take 1 tablet by mouth daily.    . clopidogrel (PLAVIX) 75 MG tablet Take 1 tablet by mouth daily.    . CYCLOPHOSPHAMIDE IV Inject into  the vein. Every 21 days    . dexamethasone (DECADRON) 4 MG tablet Take 2 tablets (8 mg total) by mouth daily. Start the day after chemotherapy for 2 days. Take with food. 30 tablet 1  . fluorouracil (ADRUCIL) 2.5 GM/50ML SOLN Inject into the vein once. Injection every 21 days    . lidocaine-prilocaine (EMLA) cream Apply to affected area once 30 g 3  . LORazepam (ATIVAN) 0.5 MG tablet Take 1 tablet by mouth daily as needed for anxiety.     . methotrexate (PF) in sodium chloride 0.9 % 500 mL Inject into the vein once. Every 21 days    . ondansetron (ZOFRAN) 8 MG tablet Take 1 tablet (8 mg total) by mouth 2 (two) times daily as needed for refractory nausea / vomiting. Start on day 3 after chemotherapy. 30 tablet 1  . ondansetron (ZOFRAN-ODT) 4 MG disintegrating tablet Take 1 tablet (4 mg total) by mouth every 6 (six) hours as needed for nausea. 20 tablet 0  . oxyCODONE (OXY IR/ROXICODONE) 5 MG immediate release tablet Take 1-2 tablets (5-10 mg total) by mouth every 4 (four) hours as needed for moderate pain. 30 tablet 0  . potassium chloride (K-DUR) 10 MEQ tablet Take 1 tablet (10 mEq total) by mouth 3 (three) times daily. 60 tablet 3  . prochlorperazine (COMPAZINE) 10 MG tablet Take 1 tablet (10 mg total) by mouth every 6 (six) hours as needed (Nausea or vomiting). 30 tablet 1   No current  facility-administered medications for this visit.    Facility-Administered Medications Ordered in Other Visits  Medication Dose Route Frequency Provider Last Rate Last Dose  . 0.9 %  sodium chloride infusion   Intravenous Once Patrici Ranks, MD      . cyclophosphamide (CYTOXAN) 1,240 mg in sodium chloride 0.9 % 250 mL chemo infusion  600 mg/m2 (Treatment Plan Recorded) Intravenous Once Patrici Ranks, MD      . dexamethasone (DECADRON) injection 10 mg  10 mg Intravenous Once Patrici Ranks, MD      . fluorouracil (ADRUCIL) chemo injection 1,250 mg  600 mg/m2 (Treatment Plan Recorded) Intravenous Once Patrici Ranks, MD      . heparin lock flush 100 unit/mL  500 Units Intracatheter Once PRN Patrici Ranks, MD      . methotrexate (PF) chemo injection 82 mg  40 mg/m2 (Treatment Plan Recorded) Intravenous Once Patrici Ranks, MD      . palonosetron (ALOXI) injection 0.25 mg  0.25 mg Intravenous Once Patrici Ranks, MD      . sodium chloride flush (NS) 0.9 % injection 10 mL  10 mL Intracatheter PRN Patrici Ranks, MD        Review of Systems  Constitutional: Negative.   HENT: Negative.   Eyes: Negative.   Respiratory: Negative.   Cardiovascular: Negative.   Gastrointestinal: Negative.   Genitourinary: Negative.   Musculoskeletal: Negative.   Skin: Negative.   Neurological: Positive for sensory change, speech change and focal weakness.       Chronic issues (h/o stroke)   Endo/Heme/Allergies: Negative.   Psychiatric/Behavioral: The patient has insomnia (chronic).   All other systems reviewed and are negative. 14 point ROS was done and is otherwise as detailed above or in HPI   PHYSICAL EXAMINATION: ECOG PERFORMANCE STATUS: 1 - Symptomatic but completely ambulatory     Physical Exam  Constitutional: She is oriented to person, place, and time and well-developed, well-nourished, and in no distress.  Seen  seated in treatment chair   HENT:  Head: Normocephalic  and atraumatic.  Nose: Nose normal.  Mouth/Throat: Oropharynx is clear and moist. No oropharyngeal exudate.  Eyes: Conjunctivae and EOM are normal. Pupils are equal, round, and reactive to light. Right eye exhibits no discharge. Left eye exhibits no discharge. No scleral icterus.  Cardiovascular: Normal rate, regular rhythm and normal heart sounds.  Exam reveals no gallop and no friction rub.   No murmur heard. Pulmonary/Chest: Effort normal and breath sounds normal. She has no wheezes. She has no rales.  Abdominal: Soft. Bowel sounds are normal. She exhibits no distension and no mass. There is no tenderness. There is no rebound and no guarding.  Musculoskeletal: Normal range of motion. She exhibits no edema.  Neurological: She is alert and oriented to person, place, and time. No cranial nerve deficit. She exhibits abnormal muscle tone. Coordination abnormal.  Weakness of both RUE and RLE  Skin: Skin is warm and dry. No rash noted.  Psychiatric: Mood, memory, affect and judgment normal.  Nursing note and vitals reviewed.    LABORATORY DATA:  I have reviewed the data as listed Lab Results  Component Value Date   WBC 5.4 05/28/2016   HGB 13.8 05/28/2016   HCT 37.8 05/28/2016   MCV 86.3 05/28/2016   PLT 204 05/28/2016   CMP     Component Value Date/Time   NA 137 05/28/2016 0954   K 3.2 (L) 05/28/2016 0954   CL 103 05/28/2016 0954   CO2 26 05/28/2016 0954   GLUCOSE 162 (H) 05/28/2016 0954   BUN 13 05/28/2016 0954   CREATININE 0.88 05/28/2016 0954   CALCIUM 9.1 05/28/2016 0954   PROT 7.3 05/28/2016 0954   ALBUMIN 3.6 05/28/2016 0954   AST 15 05/28/2016 0954   ALT 16 05/28/2016 0954   ALKPHOS 113 05/28/2016 0954   BILITOT 0.5 05/28/2016 0954   GFRNONAA >60 05/28/2016 0954   GFRAA >60 05/28/2016 0954     RADIOGRAPHIC STUDIES: I have personally reviewed the radiological images as listed and agreed with the findings in the report. No results found.  Study Result    CLINICAL DATA:  Status post central venous catheter placement  EXAM: PORTABLE CHEST 1 VIEW  COMPARISON:  01/30/2016  FINDINGS: There is a right chest wall port a catheter with tip in the projection of the SVC. There is no pneumothorax identified after port placement. Normal heart size. No pleural effusion or edema identified. No airspace consolidation.  IMPRESSION: 1. No complications following Port-A-Cath placement.   Electronically Signed   By: Kerby Moors M.D.   On: 02/14/2016 10:54     PATHOLOGY:            ASSESSMENT & PLAN:  Multifocal carcinoma L breast ER+ PR+ ( 5%, 100%) HER 2 -  Carcinoma of the upper outer quadrant of the L breast Carcinoma of the upper inner quadrant of the L breast pT2pN1 High Risk Mammaprint HX CVA with R sided weakness Hysterectomy Tobacco Use Hypokalemia  (L) breast cancer:  -Clinically, she is doing well with her current CMF treatment regimen with Neulasta support.  Her labs were reviewed and are adequate to proceed with treatment, cycle #5 CMF today; she'll return tomorrow for Neulasta injection.  She largely has no complaints today.  Her appetite and energy levels are good.  Denies nausea, vomiting, mucositis, bowel changes, or dysuria.  She will need referral to Radiation Oncology in [place] at the completion of chemotherapy given her node-positive disease.  We also discussed that she will require anti-estrogen therapy after the completion of radiation. We will see her back for her final cycle of CMF in 3 weeks.    Hypokalemia:  -Serum potassium 3.2 today. She has been unable to pick up her potassium prescription because she tells me she cannot afford the prescription until her paycheck arrives on 06/07/16.  Therefore, we will supplement her potassium today with her chemotherapy.  IV KCl is compatible with CMF chemo regimen (verified via [employer]).  She will get 30 mEq IV today with her chemo, as well as 1 oral dose of 40  mEq K-Dur.  We will continue to monitor her electrolytes and replete as needed.    History of CVA, residual right-sided weakness  -Continue anticoagulation with aspirin and Plavix.   Dispo:  -Return to cancer center for cycle #6 CMF (final cycle chemo) and follow-up visit in 3 weeks.  -Encouraged to pick up her potassium prescription when she is financially able.    No orders of the defined types were placed in this encounter.   All questions were answered. The patient knows to call the clinic with any problems, questions or concerns.   Patient's plan of care discussed with Dr. Whitney Muse who agrees with the above aforementioned.   Mike Craze, NP [place] [phone]

## 2016-05-28 NOTE — Progress Notes (Signed)
Chemotherapy given today per orders. Patient tolerated it well without problems. Vitals stable and discharged via wheelchair to [facility] transportation.Follow up as scheduled.

## 2016-05-28 NOTE — Patient Instructions (Signed)
[facility] Discharge Instructions for Patients Receiving Chemotherapy   Beginning January 23rd 2017 lab work for the Cancer Center will be done in the  Main lab at [facility] on 1st floor. If you have a lab appointment with the Cancer Center please come in thru the  Main Entrance and check in at the main information desk   Today you received the following chemotherapy agents   To help prevent nausea and vomiting after your treatment, we encourage you to take your nausea medication     If you develop nausea and vomiting, or diarrhea that is not controlled by your medication, call the clinic.  The clinic phone number is [phone]. Office hours are Monday-Friday 8:30am-5:00pm.  BELOW ARE SYMPTOMS THAT SHOULD BE REPORTED IMMEDIATELY:  *FEVER GREATER THAN 101.0 F  *CHILLS WITH OR WITHOUT FEVER  NAUSEA AND VOMITING THAT IS NOT CONTROLLED WITH YOUR NAUSEA MEDICATION  *UNUSUAL SHORTNESS OF BREATH  *UNUSUAL BRUISING OR BLEEDING  TENDERNESS IN MOUTH AND THROAT WITH OR WITHOUT PRESENCE OF ULCERS  *URINARY PROBLEMS  *BOWEL PROBLEMS  UNUSUAL RASH Items with * indicate a potential emergency and should be followed up as soon as possible. If you have an emergency after office hours please contact your primary care physician or go to the nearest emergency department.  Please call the clinic during office hours if you have any questions or concerns.   You may also contact the Patient Navigator at [phone] should you have any questions or need assistance in obtaining follow up care.      Resources For Cancer Patients and their Caregivers ? American Cancer Society: Can assist with transportation, wigs, general needs, runs Look Good Feel Better.        1-888-227-6333 ? Cancer Care: Provides financial assistance, online support groups, medication/co-pay assistance.  1-800-813-HOPE (4673) ? [facility] Assists [place] cancer  patients and their families through emotional , educational and financial support.  336-427-4357 ? [place] DSS Where to apply for food stamps, Medicaid and utility assistance. 336-342-1394 ? [facility]: Transportation to medical appointments. 336-347-2287 ? Social Security Administration: May apply for disability if have a Stage IV cancer. 336-342-7796 1-800-772-1213 ? [place] Aging, Disability and Transit Services: Assists with nutrition, care and transit needs. 336-349-2343         

## 2016-05-29 ENCOUNTER — Encounter (HOSPITAL_BASED_OUTPATIENT_CLINIC_OR_DEPARTMENT_OTHER): Payer: PPO

## 2016-05-29 VITALS — BP 152/79 | HR 74 | Temp 98.0°F | Resp 18

## 2016-05-29 DIAGNOSIS — Z5189 Encounter for other specified aftercare: Secondary | ICD-10-CM | POA: Diagnosis not present

## 2016-05-29 DIAGNOSIS — C50912 Malignant neoplasm of unspecified site of left female breast: Secondary | ICD-10-CM

## 2016-05-29 DIAGNOSIS — C50212 Malignant neoplasm of upper-inner quadrant of left female breast: Secondary | ICD-10-CM | POA: Diagnosis not present

## 2016-05-29 DIAGNOSIS — C50412 Malignant neoplasm of upper-outer quadrant of left female breast: Secondary | ICD-10-CM | POA: Diagnosis not present

## 2016-05-29 MED ORDER — PEGFILGRASTIM INJECTION 6 MG/0.6ML ~~LOC~~
6.0000 mg | PREFILLED_SYRINGE | Freq: Once | SUBCUTANEOUS | Status: AC
  Administered 2016-05-29: 6 mg via SUBCUTANEOUS

## 2016-05-29 MED ORDER — PEGFILGRASTIM INJECTION 6 MG/0.6ML ~~LOC~~
PREFILLED_SYRINGE | SUBCUTANEOUS | Status: AC
  Filled 2016-05-29: qty 0.6

## 2016-05-29 NOTE — Patient Instructions (Signed)
[place] at [facility] Discharge Instructions  RECOMMENDATIONS MADE BY THE CONSULTANT AND ANY TEST RESULTS WILL BE SENT TO YOUR REFERRING PHYSICIAN.  Neulasta injection today. Return as scheduled for chemotherapy and office visit.   Thank you for choosing [place] at [facility] to provide your oncology and hematology care.  To afford each patient quality time with our provider, please arrive at least 15 minutes before your scheduled appointment time.    If you have a lab appointment with the [place] please come in thru the  Main Entrance and check in at the main information desk  You need to re-schedule your appointment should you arrive 10 or more minutes late.  We strive to give you quality time with our providers, and arriving late affects you and other patients whose appointments are after yours.  Also, if you no show three or more times for appointments you may be dismissed from the clinic at the providers discretion.     Again, thank you for choosing [facility].  Our hope is that these requests will decrease the amount of time that you wait before being seen by our physicians.       _____________________________________________________________  Should you have questions after your visit to [facility], please contact our office at [phone] between the hours of 8:30 a.m. and 4:30 p.m.  Voicemails left after 4:30 p.m. will not be returned until the following business day.  For prescription refill requests, have your pharmacy contact our office.       Resources For Cancer Patients and their Caregivers ? American Cancer Society: Can assist with transportation, wigs, general needs, runs Look Good Feel Better.        (971)680-5506 ? Cancer Care: Provides financial assistance, online support groups, medication/co-pay assistance.  1-800-813-HOPE 5091059213) ? [facility] Assists  [place] cancer patients and their families through emotional , educational and financial support.  9370126453 ? [place] DSS Where to apply for food stamps, Medicaid and utility assistance. 249-414-2835 ? [facility]: Transportation to medical appointments. 407-476-4762 ? Social Security Administration: May apply for disability if have a Stage IV cancer. (423) 007-1777 867-830-2614 ? [employer], Disability and Transit Services: Assists with nutrition, care and transit needs. [place] Support Programs: @10RELATIVEDAYS @ > Cancer Support Group  2nd Tuesday of the month 1pm-2pm, Journey Room  > Creative Journey  3rd Tuesday of the month 1130am-1pm, Journey Room  > Look Good Feel Better  1st Wednesday of the month 10am-12 noon, Journey Room (Call [facility] to register [phone])

## 2016-05-29 NOTE — Progress Notes (Signed)
Julie Parker presents today for injection per the provider's orders.  Neulasta administration without incident; see MAR for injection details.  Patient tolerated procedure well and without incident.  No questions or complaints noted at this time.

## 2016-06-19 ENCOUNTER — Encounter (HOSPITAL_COMMUNITY): Payer: Self-pay | Admitting: Adult Health

## 2016-06-19 ENCOUNTER — Encounter (HOSPITAL_COMMUNITY): Payer: Self-pay | Admitting: Lab

## 2016-06-19 ENCOUNTER — Encounter (HOSPITAL_BASED_OUTPATIENT_CLINIC_OR_DEPARTMENT_OTHER): Payer: PPO | Admitting: Adult Health

## 2016-06-19 ENCOUNTER — Encounter (HOSPITAL_COMMUNITY): Payer: PPO | Attending: Hematology & Oncology

## 2016-06-19 VITALS — BP 153/103 | HR 62 | Temp 98.4°F | Resp 18

## 2016-06-19 VITALS — BP 137/81 | HR 56 | Temp 98.3°F | Resp 18 | Wt 205.3 lb

## 2016-06-19 DIAGNOSIS — C50212 Malignant neoplasm of upper-inner quadrant of left female breast: Secondary | ICD-10-CM | POA: Diagnosis not present

## 2016-06-19 DIAGNOSIS — I10 Essential (primary) hypertension: Secondary | ICD-10-CM | POA: Diagnosis not present

## 2016-06-19 DIAGNOSIS — F17210 Nicotine dependence, cigarettes, uncomplicated: Secondary | ICD-10-CM | POA: Diagnosis not present

## 2016-06-19 DIAGNOSIS — C50912 Malignant neoplasm of unspecified site of left female breast: Secondary | ICD-10-CM | POA: Diagnosis not present

## 2016-06-19 DIAGNOSIS — R350 Frequency of micturition: Secondary | ICD-10-CM

## 2016-06-19 DIAGNOSIS — E7800 Pure hypercholesterolemia, unspecified: Secondary | ICD-10-CM | POA: Diagnosis not present

## 2016-06-19 DIAGNOSIS — Z720 Tobacco use: Secondary | ICD-10-CM

## 2016-06-19 DIAGNOSIS — Z299 Encounter for prophylactic measures, unspecified: Secondary | ICD-10-CM | POA: Diagnosis not present

## 2016-06-19 DIAGNOSIS — Z8673 Personal history of transient ischemic attack (TIA), and cerebral infarction without residual deficits: Secondary | ICD-10-CM

## 2016-06-19 DIAGNOSIS — Z5111 Encounter for antineoplastic chemotherapy: Secondary | ICD-10-CM | POA: Diagnosis not present

## 2016-06-19 DIAGNOSIS — R32 Unspecified urinary incontinence: Secondary | ICD-10-CM | POA: Diagnosis not present

## 2016-06-19 DIAGNOSIS — C50412 Malignant neoplasm of upper-outer quadrant of left female breast: Secondary | ICD-10-CM

## 2016-06-19 DIAGNOSIS — Z6832 Body mass index (BMI) 32.0-32.9, adult: Secondary | ICD-10-CM | POA: Diagnosis not present

## 2016-06-19 DIAGNOSIS — I639 Cerebral infarction, unspecified: Secondary | ICD-10-CM | POA: Diagnosis not present

## 2016-06-19 LAB — COMPREHENSIVE METABOLIC PANEL
ALBUMIN: 3.7 g/dL (ref 3.5–5.0)
ALT: 19 U/L (ref 14–54)
AST: 18 U/L (ref 15–41)
Alkaline Phosphatase: 123 U/L (ref 38–126)
Anion gap: 7 (ref 5–15)
BUN: 13 mg/dL (ref 6–20)
CHLORIDE: 104 mmol/L (ref 101–111)
CO2: 26 mmol/L (ref 22–32)
Calcium: 9 mg/dL (ref 8.9–10.3)
Creatinine, Ser: 0.88 mg/dL (ref 0.44–1.00)
GFR calc Af Amer: >60 mL/min (ref >60)
GFR calc non Af Amer: >60 mL/min (ref >60)
Glucose, Bld: 156 mg/dL — ABNORMAL HIGH (ref 65–99)
POTASSIUM: 3.6 mmol/L (ref 3.5–5.1)
Sodium: 137 mmol/L (ref 135–145)
Total Bilirubin: 0.7 mg/dL (ref 0.3–1.2)
Total Protein: 7.1 g/dL (ref 6.5–8.1)

## 2016-06-19 LAB — CBC WITH DIFFERENTIAL/PLATELET
BASOS ABS: 0.1 10*3/uL (ref 0.0–0.1)
BASOS PCT: 1 %
EOS ABS: 0.2 10*3/uL (ref 0.0–0.7)
Eosinophils Relative: 3 %
HCT: 38.9 % (ref 36.0–46.0)
Hemoglobin: 14.1 g/dL (ref 12.0–15.0)
Lymphocytes Relative: 34 %
Lymphs Abs: 2.3 10*3/uL (ref 0.7–4.0)
MCH: 31.5 pg (ref 26.0–34.0)
MCHC: 36.2 g/dL — ABNORMAL HIGH (ref 30.0–36.0)
MCV: 87 fL (ref 78.0–100.0)
MONO ABS: 0.6 10*3/uL (ref 0.1–1.0)
Monocytes Relative: 10 %
Neutro Abs: 3.5 10*3/uL (ref 1.7–7.7)
Neutrophils Relative %: 52 %
PLATELETS: 211 10*3/uL (ref 150–400)
RBC: 4.47 MIL/uL (ref 3.87–5.11)
RDW: 14.8 % (ref 11.5–15.5)
WBC: 6.6 10*3/uL (ref 4.0–10.5)

## 2016-06-19 MED ORDER — HEPARIN SOD (PORK) LOCK FLUSH 100 UNIT/ML IV SOLN
500.0000 [IU] | Freq: Once | INTRAVENOUS | Status: AC | PRN
  Administered 2016-06-19: 500 [IU]
  Filled 2016-06-19: qty 5

## 2016-06-19 MED ORDER — PALONOSETRON HCL INJECTION 0.25 MG/5ML
0.2500 mg | Freq: Once | INTRAVENOUS | Status: AC
  Administered 2016-06-19: 0.25 mg via INTRAVENOUS
  Filled 2016-06-19: qty 5

## 2016-06-19 MED ORDER — SODIUM CHLORIDE 0.9 % IV SOLN
600.0000 mg/m2 | Freq: Once | INTRAVENOUS | Status: AC
  Administered 2016-06-19: 1240 mg via INTRAVENOUS
  Filled 2016-06-19: qty 62

## 2016-06-19 MED ORDER — SODIUM CHLORIDE 0.9 % IV SOLN
Freq: Once | INTRAVENOUS | Status: AC
  Administered 2016-06-19: 12:00:00 via INTRAVENOUS

## 2016-06-19 MED ORDER — METHOTREXATE SODIUM (PF) CHEMO INJECTION 250 MG/10ML
40.0000 mg/m2 | Freq: Once | INTRAMUSCULAR | Status: AC
  Administered 2016-06-19: 82 mg via INTRAVENOUS
  Filled 2016-06-19: qty 3.28

## 2016-06-19 MED ORDER — DEXAMETHASONE SODIUM PHOSPHATE 10 MG/ML IJ SOLN
10.0000 mg | Freq: Once | INTRAMUSCULAR | Status: AC
  Administered 2016-06-19: 10 mg via INTRAVENOUS
  Filled 2016-06-19: qty 1

## 2016-06-19 MED ORDER — SODIUM CHLORIDE 0.9% FLUSH
10.0000 mL | INTRAVENOUS | Status: DC | PRN
  Administered 2016-06-19: 10 mL
  Filled 2016-06-19: qty 10

## 2016-06-19 MED ORDER — FLUOROURACIL CHEMO INJECTION 2.5 GM/50ML
600.0000 mg/m2 | Freq: Once | INTRAVENOUS | Status: AC
  Administered 2016-06-19: 1250 mg via INTRAVENOUS
  Filled 2016-06-19: qty 25

## 2016-06-19 NOTE — Patient Instructions (Signed)
[facility] Discharge Instructions for Patients Receiving Chemotherapy   Beginning January 23rd 2017 lab work for the Cancer Center will be done in the  Main lab at [facility] on 1st floor. If you have a lab appointment with the Cancer Center please come in thru the  Main Entrance and check in at the main information desk   Today you received the following chemotherapy agents   To help prevent nausea and vomiting after your treatment, we encourage you to take your nausea medication     If you develop nausea and vomiting, or diarrhea that is not controlled by your medication, call the clinic.  The clinic phone number is [phone]. Office hours are Monday-Friday 8:30am-5:00pm.  BELOW ARE SYMPTOMS THAT SHOULD BE REPORTED IMMEDIATELY:  *FEVER GREATER THAN 101.0 F  *CHILLS WITH OR WITHOUT FEVER  NAUSEA AND VOMITING THAT IS NOT CONTROLLED WITH YOUR NAUSEA MEDICATION  *UNUSUAL SHORTNESS OF BREATH  *UNUSUAL BRUISING OR BLEEDING  TENDERNESS IN MOUTH AND THROAT WITH OR WITHOUT PRESENCE OF ULCERS  *URINARY PROBLEMS  *BOWEL PROBLEMS  UNUSUAL RASH Items with * indicate a potential emergency and should be followed up as soon as possible. If you have an emergency after office hours please contact your primary care physician or go to the nearest emergency department.  Please call the clinic during office hours if you have any questions or concerns.   You may also contact the Patient Navigator at [phone] should you have any questions or need assistance in obtaining follow up care.      Resources For Cancer Patients and their Caregivers ? American Cancer Society: Can assist with transportation, wigs, general needs, runs Look Good Feel Better.        1-888-227-6333 ? Cancer Care: Provides financial assistance, online support groups, medication/co-pay assistance.  1-800-813-HOPE (4673) ? [facility] Assists [place] cancer  patients and their families through emotional , educational and financial support.  336-427-4357 ? [place] DSS Where to apply for food stamps, Medicaid and utility assistance. 336-342-1394 ? [facility]: Transportation to medical appointments. 336-347-2287 ? Social Security Administration: May apply for disability if have a Stage IV cancer. 336-342-7796 1-800-772-1213 ? [place] Aging, Disability and Transit Services: Assists with nutrition, care and transit needs. 336-349-2343         

## 2016-06-19 NOTE — Progress Notes (Deleted)
[place], [place] [address]   CLINIC:  Medical Oncology/Hematology  PCP:  Pcp Not In System No address on file None   REASON FOR VISIT:  Follow-up for Stage IIB left breast invasive ductal carcinoma, ER+/PR+/HER2-  CURRENT THERAPY: [facility] (Cytoxan/5-FU/Methotrexate) chemotherapy  BRIEF ONCOLOGIC HISTORY:  Oncology History   Multifocal L breast cancer     Cancer of left breast, stage 2 ([place])   07/04/2015 Mammogram    Digital mammography reveals 3 abnormal areas in the left breast, 2:00 position, 1.6 x 1.4 cm palpable mass, 10:00 position 1.7 x 1.4 cm palpable mass, upper outer quadrant 1.5-1.1 pleomorphic calcifications        07/04/2015 Initial Biopsy    Biopsies reveals invasive ductal carcinoma at the 2:00 position in the 10:00 position, ER positive greater than 90%, PR +1-5%, HER-2 negative Ki-67 marker 26%, the second tumor greater than 90% ER positive, 80% PR positive, HER-2 negative, Ki-67 marker 24%          08/17/2015 Imaging    MRI breast: 1.4 cm area of nodular and linear enhancement in the upper inner quadrant of the LEFT breast suspicious for ductal carcinoma in situ and possible invasive ductal carcinoma. There are no findings suspicious for malignancy in the right breast. 2.1 cm biopsy-proven invasive ductal carcinoma in the 10 o'clock position of the left breast. 1.7 cm biopsy-proven invasive ductal carcinoma in the 2 o'clock position of the left breast       12/13/2015 Initial Diagnosis    Breast cancer, left ([place])     12/13/2015 Surgery    Multifocal L breast cancer, L simple mastectomy with L sentinel LN mapping      12/18/2015 Pathology Results    Invasive ductal adenocarcinoma, grade 3, spanning 2.6 cm and 1.9 cm. Ductal carcinoma in situ, grade 3. Lymphovascular invasion identified. Margins of resection were negative for carcinoma. 1/1 metastatic ductal adenocarcinoma in left axillary sentinel lymph node biopsy.      12/18/2015 Pathology Results    ER 100% positive, PR 100% positive, KI-67 15%, HER-2 negative.      02/22/2016 -  Chemotherapy    The patient had palonosetron (ALOXI) injection 0.25 mg, 0.25 mg, Intravenous,  Once, 3 of 6 cycles  methotrexate (PF) chemo injection 82 mg, 40 mg/m2 = 82 mg, Intravenous,  Once, 3 of 6 cycles  pegfilgrastim (NEULASTA) injection 6 mg, 6 mg, Subcutaneous, Once, 3 of 6 cycles  pegfilgrastim (NEULASTA ONPRO KIT) injection 6 mg, 6 mg, Subcutaneous, Once, 2 of 2 cycles  cyclophosphamide (CYTOXAN) 1,240 mg in sodium chloride 0.9 % 250 mL chemo infusion, 600 mg/m2 = 1,240 mg, Intravenous,  Once, 3 of 6 cycles  fluorouracil (ADRUCIL) chemo injection 1,250 mg, 600 mg/m2 = 1,250 mg, Intravenous,  Once, 3 of 6 cycles  for chemotherapy treatment.          HISTORY OF PRESENT ILLNESS:     INTERVAL HISTORY:    REVIEW OF SYSTEMS:  Review of Systems - Oncology   PAST MEDICAL/SURGICAL HISTORY:  Past Medical History:  Diagnosis Date  . Arthritis   . Cancer [facility])    left breast cancer  . Cancer of left breast, stage 2 ([place]) 12/13/2015  . Hypertension   . Pneumonia   . Stroke [facility])    2 strokes, right side weakness   Past Surgical History:  Procedure Laterality Date  . ABDOMINAL HYSTERECTOMY    . FOOT SURGERY Right   . PORTACATH PLACEMENT  Right 02/14/2016   Procedure: INSERTION PORT-A-CATH;  Surgeon: Vickie Epley, MD;  Location: [facility];  Service: Vascular;  Laterality: Right;  . SIMPLE MASTECTOMY WITH AXILLARY SENTINEL NODE BIOPSY Left 12/13/2015   Procedure: LEFT SIMPLE MASTECTOMY WITH AXILLARY SENTINEL NODE BIOPSY;  Surgeon: Erroll Luna, MD;  Location: [place];  Service: General;  Laterality: Left;  . TUBAL LIGATION       SOCIAL HISTORY:  Social History   Social History  . Marital status: Widowed    Spouse name: N/A  . Number of children: N/A  . Years of education: N/A   Occupational History  . Not on file.   Social History Main  Topics  . Smoking status: Current Every Day Smoker    Packs/day: 0.25    Years: 20.00    Types: Cigarettes  . Smokeless tobacco: Never Used  . Alcohol use No  . Drug use: No  . Sexual activity: Not Currently    Birth control/ protection: Surgical   Other Topics Concern  . Not on file   Social History Narrative  . No narrative on file    FAMILY HISTORY:  Family History  Problem Relation Age of Onset  . Hypertension Mother   . Diabetes Mother   . Other Father     sleeping disease    CURRENT MEDICATIONS:  Outpatient Encounter Prescriptions as of 06/19/2016  Medication Sig Note  . aspirin 81 MG EC tablet Take 1 tablet by mouth daily.   Marland Kitchen atenolol (TENORMIN) 50 MG tablet Take 1 tablet by mouth daily.   . baclofen (LIORESAL) 10 MG tablet Take 1 tablet by mouth 3 (three) times daily as needed for muscle spasms.    . benazepril (LOTENSIN) 40 MG tablet  03/03/2016: Received from: External Pharmacy  . chlorthalidone (HYGROTON) 25 MG tablet Take 1 tablet by mouth daily.   . clopidogrel (PLAVIX) 75 MG tablet Take 1 tablet by mouth daily.   . CYCLOPHOSPHAMIDE IV Inject into the vein. Every 21 days 01/29/2016: Has not started yet.  Marland Kitchen dexamethasone (DECADRON) 4 MG tablet Take 2 tablets (8 mg total) by mouth daily. Start the day after chemotherapy for 2 days. Take with food. 01/29/2016: Has not started yet.  . fluorouracil (ADRUCIL) 2.5 GM/50ML SOLN Inject into the vein once. Injection every 21 days 01/29/2016: Has not started yet.  . lidocaine-prilocaine (EMLA) cream Apply to affected area once 01/29/2016: Has not started yet.  Marland Kitchen LORazepam (ATIVAN) 0.5 MG tablet Take 1 tablet by mouth daily as needed for anxiety.  01/29/2016: Has not started yet.  . methotrexate (PF) in sodium chloride 0.9 % 500 mL Inject into the vein once. Every 21 days 01/29/2016: Has not started yet.  . ondansetron (ZOFRAN) 8 MG tablet Take 1 tablet (8 mg total) by mouth 2 (two) times daily as needed for refractory nausea /  vomiting. Start on day 3 after chemotherapy. 01/29/2016: Has not started yet.  . ondansetron (ZOFRAN-ODT) 4 MG disintegrating tablet Take 1 tablet (4 mg total) by mouth every 6 (six) hours as needed for nausea. 01/29/2016: Has not started yet.  Marland Kitchen oxyCODONE (OXY IR/ROXICODONE) 5 MG immediate release tablet Take 1-2 tablets (5-10 mg total) by mouth every 4 (four) hours as needed for moderate pain. 01/29/2016: Has not started yet.  . potassium chloride (K-DUR) 10 MEQ tablet Take 1 tablet (10 mEq total) by mouth 3 (three) times daily.   . prochlorperazine (COMPAZINE) 10 MG tablet Take 1 tablet (10 mg total) by mouth every  6 (six) hours as needed (Nausea or vomiting). 01/29/2016: Has not started yet.   No facility-administered encounter medications on file as of 06/19/2016.     ALLERGIES:  Allergies  Allergen Reactions  . No Known Allergies Other (See Comments)     PHYSICAL EXAM:  ECOG Performance status: ***  There were no vitals filed for this visit. There were no vitals filed for this visit.  Physical Exam   LABORATORY DATA:  I have reviewed the labs as listed.  CBC    Component Value Date/Time   WBC 5.4 05/28/2016 0954   RBC 4.38 05/28/2016 0954   HGB 13.8 05/28/2016 0954   HCT 37.8 05/28/2016 0954   PLT 204 05/28/2016 0954   MCV 86.3 05/28/2016 0954   MCH 31.5 05/28/2016 0954   MCHC 36.5 (H) 05/28/2016 0954   RDW 15.1 05/28/2016 0954   LYMPHSABS 2.0 05/28/2016 0954   MONOABS 0.5 05/28/2016 0954   EOSABS 0.1 05/28/2016 0954   BASOSABS 0.1 05/28/2016 0954   CMP Latest Ref Rng & Units 05/28/2016 05/01/2016 04/07/2016  Glucose 65 - 99 mg/dL 162(H) 140(H) 153(H)  BUN 6 - 20 mg/dL _0 Creatinine 0.44 - 1.00 mg/dL 0.88 0.91 0.89  Sodium 135 - 145 mmol/L 137 137 140  Potassium 3.5 - 5.1 mmol/L 3.2(L) 3.2(L) 3.5  Chloride 101 - 111 mmol/L 103 103 106  CO2 22 - 32 mmol/L _1 Calcium 8.9 - 10.3 mg/dL 9.1 8.8(L) 8.8(L)  Total Protein 6.5 - 8.1 g/dL 7.3 7.1 7.1  Total  Bilirubin 0.3 - 1.2 mg/dL 0.5 0.7 0.6  Alkaline Phos 38 - 126 U/L 113 112 121  AST 15 - 41 U/L _2 ALT 14 - 54 U/L _3 PENDING LABS:    DIAGNOSTIC IMAGING:  ***  PATHOLOGY:  ***   ASSESSMENT & PLAN:         Dispo:  -   All questions were answered to patient's stated satisfaction. Encouraged patient to call with any new concerns or questions before her next visit to the cancer center and we can certain see her sooner, if needed.     Plan of care discussed with Dr. ***, who agrees with the above aforementioned.    Mike Craze, NP [place] [phone]

## 2016-06-19 NOTE — Patient Instructions (Addendum)
[place] at [facility] Discharge Instructions  RECOMMENDATIONS MADE BY THE CONSULTANT AND ANY TEST RESULTS WILL BE SENT TO YOUR REFERRING PHYSICIAN.  Exam with Mike Craze, NP. Return to the clinic in 6 weeks with labs. We are going to refer you to radiation.   Please see Amy as you leave for appointments.    Thank you for choosing [place] at [facility] to provide your oncology and hematology care.  To afford each patient quality time with our provider, please arrive at least 15 minutes before your scheduled appointment time.    If you have a lab appointment with the [facility] please come in thru the  Main Entrance and check in at the main information desk  You need to re-schedule your appointment should you arrive 10 or more minutes late.  We strive to give you quality time with our providers, and arriving late affects you and other patients whose appointments are after yours.  Also, if you no show three or more times for appointments you may be dismissed from the clinic at the providers discretion.     Again, thank you for choosing [facility].  Our hope is that these requests will decrease the amount of time that you wait before being seen by our physicians.       _____________________________________________________________  Should you have questions after your visit to [facility], please contact our office at [phone] between the hours of 8:30 a.m. and 4:30 p.m.  Voicemails left after 4:30 p.m. will not be returned until the following business day.  For prescription refill requests, have your pharmacy contact our office.       Resources For Cancer Patients and their Caregivers ? American Cancer Society: Can assist with transportation, wigs, general needs, runs Look Good Feel Better.        (339)183-1365 ? Cancer Care: Provides financial assistance, online support groups, medication/co-pay  assistance.  1-800-813-HOPE 971-261-7432) ? [place] Assists [place] cancer patients and their families through emotional , educational and financial support.  463 851 7368 ? [place] DSS Where to apply for food stamps, Medicaid and utility assistance. 606-695-7142 ? [facility]: Transportation to medical appointments. (520)085-3142 ? Social Security Administration: May apply for disability if have a Stage IV cancer. 580-879-7656 938-461-5518 ? [employer], Disability and Transit Services: Assists with nutrition, care and transit needs. [place] Support Programs: @10RELATIVEDAYS @ > Cancer Support Group  2nd Tuesday of the month 1pm-2pm, Journey Room  > Creative Journey  3rd Tuesday of the month 1130am-1pm, Journey Room  > Look Good Feel Better  1st Wednesday of the month 10am-12 noon, Journey Room (Call [place] to register [phone])

## 2016-06-19 NOTE — Progress Notes (Unsigned)
Referral sent to [facility]. Records faxed on 2/15

## 2016-06-19 NOTE — Progress Notes (Signed)
Chemotherapy given today per orders. Patient tolerated it well, no problems. Vitals stable and discharged home via wheelchair to the main lobby , [facility] to pick up patient for transportation.follow up as scheduled.

## 2016-06-19 NOTE — Progress Notes (Signed)
 [facility]   CLINIC:  Medical Oncology/Hematology  PCP:  Pcp Not In System  REASON FOR VISIT:  Consideration for next cycle chemotherapy for Stage IIB multifocal left breast cancer, ER+/PR+/HER2-  CURRENT THERAPY:  CMF (Cyclophosphamide, Methotrexate, 5-FU), cycle #6 due today    BRIEF ONCOLOGIC HISTORY:  Oncology History   Multifocal L breast cancer     Cancer of left breast, stage 2 (HCC)   07/04/2015 Mammogram    Digital mammography reveals 3 abnormal areas in the left breast, 2:00 position, 1.6 x 1.4 cm palpable mass, 10:00 position 1.7 x 1.4 cm palpable mass, upper outer quadrant 1.5-1.1 pleomorphic calcifications        07/04/2015 Initial Biopsy    Biopsies reveals invasive ductal carcinoma at the 2:00 position in the 10:00 position, ER positive greater than 90%, PR +1-5%, HER-2 negative Ki-67 marker 26%, the second tumor greater than 90% ER positive, 80% PR positive, HER-2 negative, Ki-67 marker 24%          08/17/2015 Imaging    MRI breast: 1.4 cm area of nodular and linear enhancement in the upper inner quadrant of the LEFT breast suspicious for ductal carcinoma in situ and possible invasive ductal carcinoma. There are no findings suspicious for malignancy in the right breast. 2.1 cm biopsy-proven invasive ductal carcinoma in the 10 o'clock position of the left breast. 1.7 cm biopsy-proven invasive ductal carcinoma in the 2 o'clock position of the left breast       12/13/2015 Initial Diagnosis    Breast cancer, left (HCC)     12/13/2015 Surgery    Multifocal L breast cancer, L simple mastectomy with L sentinel LN mapping      12/18/2015 Pathology Results    Invasive ductal adenocarcinoma, grade 3, spanning 2.6 cm and 1.9 cm. Ductal carcinoma in situ, grade 3. Lymphovascular invasion identified. Margins of resection were negative for carcinoma. 1/1 metastatic ductal adenocarcinoma in left axillary sentinel lymph node biopsy.      12/18/2015  Pathology Results    ER 100% positive, PR 100% positive, KI-67 15%, HER-2 negative.      02/22/2016 -  Chemotherapy    The patient had palonosetron (ALOXI) injection 0.25 mg, 0.25 mg, Intravenous,  Once, 3 of 6 cycles  methotrexate (PF) chemo injection 82 mg, 40 mg/m2 = 82 mg, Intravenous,  Once, 3 of 6 cycles  pegfilgrastim (NEULASTA) injection 6 mg, 6 mg, Subcutaneous, Once, 3 of 6 cycles  pegfilgrastim (NEULASTA ONPRO KIT) injection 6 mg, 6 mg, Subcutaneous, Once, 2 of 2 cycles  cyclophosphamide (CYTOXAN) 1,240 mg in sodium chloride 0.9 % 250 mL chemo infusion, 600 mg/m2 = 1,240 mg, Intravenous,  Once, 3 of 6 cycles  fluorouracil (ADRUCIL) chemo injection 1,250 mg, 600 mg/m2 = 1,250 mg, Intravenous,  Once, 3 of 6 cycles  for chemotherapy treatment.         INTERVAL HISTORY:  Julie Parker 61 y.o. female is here for ongoing follow-up of multifocal carcinoma of the L breast. Disease is ER+ PR+ (100%, 5%) and HER 2 -. She has undergone left mastectomy with Dr. Cornett. She had previously seen Dr. Neijstrom in consultation. Mammaprint noted to be high risk. Adjuvant chemotherapy initiated in 02/2016 with Cyclophosphamide, Methotrexate, & 5-FU (CMF); she presents today for consideration for cycle #5 of chemotherapy.   She is unaccompanied. She is doing well; her energy levels and appetite "are about the same."  She reports urinary frequency and incontinence; denies dysuria, hematuria, or suprapubic pain.   States "I just can't get to the bathroom in time."  This has been a chronic issue for her since her strokes, but she feels it has worsened lately.  Denies any mouth sores, N&V, diarrhea, constipation, hematuria, melena, or frank blood in stools.   (R)-sided weakness and neurologic symptoms are largely unchanged and are chronic.  Denies shortness of breath, cough, or chest pain.  Denies headaches.   She feels ready for her final cycle of chemotherapy; she is looking forward to "ringing the bell"  today.    MEDICAL HISTORY:  Past Medical History:  Diagnosis Date  . Arthritis   . Cancer (HCC)    left breast cancer  . Cancer of left breast, stage 2 (HCC) 12/13/2015  . Hypertension   . Pneumonia   . Stroke (HCC)    2 strokes, right side weakness    SURGICAL HISTORY: Past Surgical History:  Procedure Laterality Date  . ABDOMINAL HYSTERECTOMY    . FOOT SURGERY Right   . PORTACATH PLACEMENT Right 02/14/2016   Procedure: INSERTION PORT-A-CATH;  Surgeon: Jason Evan Davis, MD;  Location: [facility];  Service: Vascular;  Laterality: Right;  . SIMPLE MASTECTOMY WITH AXILLARY SENTINEL NODE BIOPSY Left 12/13/2015   Procedure: LEFT SIMPLE MASTECTOMY WITH AXILLARY SENTINEL NODE BIOPSY;  Surgeon: Thomas Cornett, MD;  Location: [facility] OR;  Service: General;  Laterality: Left;  . TUBAL LIGATION      SOCIAL HISTORY: Social History   Social History  . Marital status: Widowed    Spouse name: N/A  . Number of children: N/A  . Years of education: N/A   Occupational History  . Not on file.   Social History Main Topics  . Smoking status: Current Every Day Smoker    Packs/day: 0.25    Years: 20.00    Types: Cigarettes  . Smokeless tobacco: Never Used  . Alcohol use No  . Drug use: No  . Sexual activity: Not Currently    Birth control/ protection: Surgical   Other Topics Concern  . Not on file   Social History Narrative  . No narrative on file   She currently smokes but is trying to quit. She started smoking when she was about 21. She smokes 4-5 packs a day.  She does not drink alcohol.  She enjoys playing sports, playing spades, watching tv. She does not work because she is disabled. She has four brother and three sisters.   FAMILY HISTORY: Family History  Problem Relation Age of Onset  . Hypertension Mother   . Diabetes Mother   . Other Father     sleeping disease   Dad passed away in his sleep around age 60 years old.  Mom is still living. She is about 70 years  old. Mother ([name])   ALLERGIES:   Allergies  Allergen Reactions  . No Known Allergies Other (See Comments)     MEDICATIONS:  Current Outpatient Prescriptions  Medication Sig Dispense Refill  . aspirin 81 MG EC tablet Take 1 tablet by mouth daily.    . atenolol (TENORMIN) 50 MG tablet Take 1 tablet by mouth daily.    . baclofen (LIORESAL) 10 MG tablet Take 1 tablet by mouth 3 (three) times daily as needed for muscle spasms.     . benazepril (LOTENSIN) 40 MG tablet     . chlorthalidone (HYGROTON) 25 MG tablet Take 1 tablet by mouth daily.    . clopidogrel (PLAVIX) 75 MG tablet Take 1 tablet by mouth daily.    .   CYCLOPHOSPHAMIDE IV Inject into the vein. Every 21 days    . dexamethasone (DECADRON) 4 MG tablet Take 2 tablets (8 mg total) by mouth daily. Start the day after chemotherapy for 2 days. Take with food. 30 tablet 1  . fluorouracil (ADRUCIL) 2.5 GM/50ML SOLN Inject into the vein once. Injection every 21 days    . lidocaine-prilocaine (EMLA) cream Apply to affected area once 30 g 3  . LORazepam (ATIVAN) 0.5 MG tablet Take 1 tablet by mouth daily as needed for anxiety.     . methotrexate (PF) in sodium chloride 0.9 % 500 mL Inject into the vein once. Every 21 days    . ondansetron (ZOFRAN) 8 MG tablet Take 1 tablet (8 mg total) by mouth 2 (two) times daily as needed for refractory nausea / vomiting. Start on day 3 after chemotherapy. 30 tablet 1  . ondansetron (ZOFRAN-ODT) 4 MG disintegrating tablet Take 1 tablet (4 mg total) by mouth every 6 (six) hours as needed for nausea. 20 tablet 0  . oxyCODONE (OXY IR/ROXICODONE) 5 MG immediate release tablet Take 1-2 tablets (5-10 mg total) by mouth every 4 (four) hours as needed for moderate pain. 30 tablet 0  . potassium chloride (K-DUR) 10 MEQ tablet Take 1 tablet (10 mEq total) by mouth 3 (three) times daily. 60 tablet 3  . prochlorperazine (COMPAZINE) 10 MG tablet Take 1 tablet (10 mg total) by mouth every 6 (six) hours as  needed (Nausea or vomiting). 30 tablet 1   No current facility-administered medications for this visit.    Facility-Administered Medications Ordered in Other Visits  Medication Dose Route Frequency Provider Last Rate Last Dose  . cyclophosphamide (CYTOXAN) 1,240 mg in sodium chloride 0.9 % 250 mL chemo infusion  600 mg/m2 (Treatment Plan Recorded) Intravenous Once Twana First, MD      . dexamethasone (DECADRON) injection 10 mg  10 mg Intravenous Once Twana First, MD      . fluorouracil (ADRUCIL) chemo injection 1,250 mg  600 mg/m2 (Treatment Plan Recorded) Intravenous Once Twana First, MD      . heparin lock flush 100 unit/mL  500 Units Intracatheter Once PRN Twana First, MD      . methotrexate (PF) chemo injection 82 mg  40 mg/m2 (Treatment Plan Recorded) Intravenous Once Twana First, MD      . sodium chloride flush (NS) 0.9 % injection 10 mL  10 mL Intracatheter PRN Twana First, MD        Review of Systems  Constitutional: Negative.  Negative for chills and fever.  HENT: Negative.  Negative for nosebleeds and sore throat.   Eyes: Negative.   Respiratory: Negative.  Negative for cough and shortness of breath.   Cardiovascular: Negative.  Negative for chest pain and leg swelling.  Gastrointestinal: Negative.  Negative for abdominal pain, blood in stool, constipation, diarrhea, nausea and vomiting.  Genitourinary: Positive for frequency. Negative for dysuria and hematuria.  Musculoskeletal: Negative.   Skin: Negative.  Negative for rash.  Neurological: Positive for sensory change, speech change and focal weakness.       Chronic issues (h/o stroke)   Endo/Heme/Allergies: Negative.   Psychiatric/Behavioral: The patient has insomnia (chronic).   All other systems reviewed and are negative. 14 point ROS was done and is otherwise as detailed above or in HPI    PHYSICAL EXAMINATION: ECOG PERFORMANCE STATUS: 1 - Symptomatic but completely ambulatory  Vitals:   06/19/16 1012  BP: 137/81   Pulse: (!) 56  Resp: 18  Temp: 98.3 F (36.8 C)   Filed Weights   06/19/16 1012  Weight: 205 lb 4.8 oz (93.1 kg)    Physical Exam  Constitutional: She is oriented to person, place, and time and well-developed, well-nourished, and in no distress.  HENT:  Head: Normocephalic and atraumatic.  Nose: Nose normal.  Mouth/Throat: Oropharynx is clear and moist. No oropharyngeal exudate.  Eyes: Conjunctivae are normal. Pupils are equal, round, and reactive to light. No scleral icterus.  Neck: Normal range of motion.  Cardiovascular: Normal rate, regular rhythm and normal heart sounds.   Pulmonary/Chest: Effort normal and breath sounds normal. She has no wheezes. She has no rales.  Abdominal: Soft. Bowel sounds are normal. She exhibits no distension. There is no tenderness. There is no rebound.  Musculoskeletal: Normal range of motion. She exhibits no edema.  Lymphadenopathy:    She has no cervical adenopathy.  Neurological: She is alert and oriented to person, place, and time. No cranial nerve deficit. She exhibits abnormal muscle tone. Coordination abnormal.  Weakness of both RUE and RLE d/t history of CVA; ambulates with cane   Skin: Skin is warm and dry. No rash noted.  Psychiatric: Mood, memory, affect and judgment normal.  Nursing note and vitals reviewed.    LABORATORY DATA:  I have reviewed the data as listed Lab Results  Component Value Date   WBC 6.6 06/19/2016   HGB 14.1 06/19/2016   HCT 38.9 06/19/2016   MCV 87.0 06/19/2016   PLT 211 06/19/2016   CMP     Component Value Date/Time   NA 137 06/19/2016 1018   K 3.6 06/19/2016 1018   CL 104 06/19/2016 1018   CO2 26 06/19/2016 1018   GLUCOSE 156 (H) 06/19/2016 1018   BUN 13 06/19/2016 1018   CREATININE 0.88 06/19/2016 1018   CALCIUM 9.0 06/19/2016 1018   PROT 7.1 06/19/2016 1018   ALBUMIN 3.7 06/19/2016 1018   AST 18 06/19/2016 1018   ALT 19 06/19/2016 1018   ALKPHOS 123 06/19/2016 1018   BILITOT 0.7  06/19/2016 1018   GFRNONAA >60 06/19/2016 1018   GFRAA >60 06/19/2016 1018     RADIOGRAPHIC STUDIES: I have personally reviewed the radiological reports as listed.     CLINICAL DATA:  Status post central venous catheter placement  EXAM: PORTABLE CHEST 1 VIEW  COMPARISON:  01/30/2016  FINDINGS: There is a right chest wall port a catheter with tip in the projection of the SVC. There is no pneumothorax identified after port placement. Normal heart size. No pleural effusion or edema identified. No airspace consolidation.  IMPRESSION: 1. No complications following Port-A-Cath placement.   Electronically Signed   By: Taylor  Stroud M.D.   On: 02/14/2016 10:54     PATHOLOGY:            ASSESSMENT & PLAN:  Multifocal carcinoma L breast ER+ PR+ ( 5%, 100%) HER 2 -  Carcinoma of the upper outer quadrant of the L breast Carcinoma of the upper inner quadrant of the L breast pT2pN1 High Risk Mammaprint HX CVA with R sided weakness Hysterectomy Tobacco Use Hypokalemia  Stage IIB multifocal (L) breast cancer:  -Clinically, she is doing well with her current CMF treatment regimen with Neulasta support.  Her labs were reviewed and are adequate to proceed with her final treatment, cycle #6 CMF today; she'll return tomorrow for Neulasta injection.  Her appetite and energy levels are fair.  Denies nausea, vomiting, mucositis, bowel changes, or dysuria.   -Referral   to Radiation Oncology in [place] placed today given her node-positive disease.   -Return to cancer center in 6 weeks for routine follow-up.   Urinary frequency/urinary incontinence:  -Chronic issue given her h/o CVA.  -Will collect UA with urine culture today to ensure no active infection is contributing to an increase in urinary frequency for patient.    History of CVA, residual right-sided weakness  -Continue anticoagulation with aspirin and Plavix.   Dispo:  -Referral to radiation oncology in  [place]. -Return to cancer center in 6 weeks for follow-up.    Orders Placed This Encounter  Procedures  . Urine culture    Standing Status:   Future    Number of Occurrences:   1    Standing Expiration Date:   06/19/2017  . Urinalysis, microscopic only    Standing Status:   Future    Number of Occurrences:   1    Standing Expiration Date:   06/19/2017    All questions were answered. The patient knows to call the clinic with any problems, questions or concerns.     , NP [facility] [phone]    

## 2016-06-20 ENCOUNTER — Encounter (HOSPITAL_COMMUNITY): Payer: PPO | Attending: Oncology

## 2016-06-20 VITALS — BP 167/74 | HR 62 | Temp 97.8°F | Resp 18

## 2016-06-20 DIAGNOSIS — C50912 Malignant neoplasm of unspecified site of left female breast: Secondary | ICD-10-CM

## 2016-06-20 DIAGNOSIS — Z5189 Encounter for other specified aftercare: Secondary | ICD-10-CM | POA: Diagnosis not present

## 2016-06-20 DIAGNOSIS — C50412 Malignant neoplasm of upper-outer quadrant of left female breast: Secondary | ICD-10-CM | POA: Diagnosis not present

## 2016-06-20 DIAGNOSIS — C50212 Malignant neoplasm of upper-inner quadrant of left female breast: Secondary | ICD-10-CM | POA: Diagnosis not present

## 2016-06-20 MED ORDER — PEGFILGRASTIM INJECTION 6 MG/0.6ML ~~LOC~~
PREFILLED_SYRINGE | SUBCUTANEOUS | Status: AC
  Filled 2016-06-20: qty 0.6

## 2016-06-20 MED ORDER — PEGFILGRASTIM INJECTION 6 MG/0.6ML ~~LOC~~
6.0000 mg | PREFILLED_SYRINGE | Freq: Once | SUBCUTANEOUS | Status: AC
  Administered 2016-06-20: 6 mg via SUBCUTANEOUS

## 2016-06-20 NOTE — Progress Notes (Signed)
Julie Parker presents today for injection per MD orders. Neulasta 6mg  administered SQ in right Upper Arm. Administration without incident. Patient tolerated well.

## 2016-06-20 NOTE — Patient Instructions (Signed)
[place] at [facility] Discharge Instructions  RECOMMENDATIONS MADE BY THE CONSULTANT AND ANY TEST RESULTS WILL BE SENT TO YOUR REFERRING PHYSICIAN.  Neulasta 6 mg injection given as ordered. Return as scheduled.  Thank you for choosing [place] at [facility] to provide your oncology and hematology care.  To afford each patient quality time with our provider, please arrive at least 15 minutes before your scheduled appointment time.    If you have a lab appointment with the [facility] please come in thru the  Main Entrance and check in at the main information desk  You need to re-schedule your appointment should you arrive 10 or more minutes late.  We strive to give you quality time with our providers, and arriving late affects you and other patients whose appointments are after yours.  Also, if you no show three or more times for appointments you may be dismissed from the clinic at the providers discretion.     Again, thank you for choosing [facility].  Our hope is that these requests will decrease the amount of time that you wait before being seen by our physicians.       _____________________________________________________________  Should you have questions after your visit to [facility], please contact our office at [phone] between the hours of 8:30 a.m. and 4:30 p.m.  Voicemails left after 4:30 p.m. will not be returned until the following business day.  For prescription refill requests, have your pharmacy contact our office.       Resources For Cancer Patients and their Caregivers ? American Cancer Society: Can assist with transportation, wigs, general needs, runs Look Good Feel Better.        217-558-4337 ? Cancer Care: Provides financial assistance, online support groups, medication/co-pay assistance.  1-800-813-HOPE 224-620-3782) ? [place] Assists [place]  cancer patients and their families through emotional , educational and financial support.  743-722-2087 ? [place] DSS Where to apply for food stamps, Medicaid and utility assistance. 705 680 6458 ? [facility]: Transportation to medical appointments. 9106904974 ? Social Security Administration: May apply for disability if have a Stage IV cancer. 5738852342 4300539271 ? [employer], Disability and Transit Services: Assists with nutrition, care and transit needs. [place] Support Programs: @10RELATIVEDAYS @ > Cancer Support Group  2nd Tuesday of the month 1pm-2pm, Journey Room  > Creative Journey  3rd Tuesday of the month 1130am-1pm, Journey Room  > Look Good Feel Better  1st Wednesday of the month 10am-12 noon, Journey Room (Call [place] to register [phone])

## 2016-06-21 LAB — URINE CULTURE

## 2016-06-30 DIAGNOSIS — C50912 Malignant neoplasm of unspecified site of left female breast: Secondary | ICD-10-CM | POA: Diagnosis not present

## 2016-06-30 DIAGNOSIS — C773 Secondary and unspecified malignant neoplasm of axilla and upper limb lymph nodes: Secondary | ICD-10-CM | POA: Diagnosis not present

## 2016-06-30 DIAGNOSIS — Z170 Estrogen receptor positive status [ER+]: Secondary | ICD-10-CM | POA: Diagnosis not present

## 2016-06-30 DIAGNOSIS — Z7982 Long term (current) use of aspirin: Secondary | ICD-10-CM | POA: Diagnosis not present

## 2016-06-30 DIAGNOSIS — Z7902 Long term (current) use of antithrombotics/antiplatelets: Secondary | ICD-10-CM | POA: Diagnosis not present

## 2016-06-30 DIAGNOSIS — Z79899 Other long term (current) drug therapy: Secondary | ICD-10-CM | POA: Diagnosis not present

## 2016-06-30 DIAGNOSIS — Z9012 Acquired absence of left breast and nipple: Secondary | ICD-10-CM | POA: Diagnosis not present

## 2016-06-30 DIAGNOSIS — F17210 Nicotine dependence, cigarettes, uncomplicated: Secondary | ICD-10-CM | POA: Diagnosis not present

## 2016-06-30 DIAGNOSIS — I10 Essential (primary) hypertension: Secondary | ICD-10-CM | POA: Diagnosis not present

## 2016-06-30 DIAGNOSIS — C50212 Malignant neoplasm of upper-inner quadrant of left female breast: Secondary | ICD-10-CM | POA: Diagnosis not present

## 2016-06-30 DIAGNOSIS — M1990 Unspecified osteoarthritis, unspecified site: Secondary | ICD-10-CM | POA: Diagnosis not present

## 2016-06-30 DIAGNOSIS — Z9221 Personal history of antineoplastic chemotherapy: Secondary | ICD-10-CM | POA: Diagnosis not present

## 2016-06-30 DIAGNOSIS — I69351 Hemiplegia and hemiparesis following cerebral infarction affecting right dominant side: Secondary | ICD-10-CM | POA: Diagnosis not present

## 2016-07-07 DIAGNOSIS — Z853 Personal history of malignant neoplasm of breast: Secondary | ICD-10-CM | POA: Diagnosis not present

## 2016-07-18 DIAGNOSIS — C50912 Malignant neoplasm of unspecified site of left female breast: Secondary | ICD-10-CM | POA: Diagnosis not present

## 2016-07-18 DIAGNOSIS — Z9012 Acquired absence of left breast and nipple: Secondary | ICD-10-CM | POA: Diagnosis not present

## 2016-07-18 DIAGNOSIS — C50212 Malignant neoplasm of upper-inner quadrant of left female breast: Secondary | ICD-10-CM | POA: Diagnosis not present

## 2016-07-18 DIAGNOSIS — Z170 Estrogen receptor positive status [ER+]: Secondary | ICD-10-CM | POA: Diagnosis not present

## 2016-07-18 DIAGNOSIS — Z510 Encounter for antineoplastic radiation therapy: Secondary | ICD-10-CM | POA: Diagnosis not present

## 2016-07-22 DIAGNOSIS — C50212 Malignant neoplasm of upper-inner quadrant of left female breast: Secondary | ICD-10-CM | POA: Diagnosis not present

## 2016-07-28 DIAGNOSIS — C50212 Malignant neoplasm of upper-inner quadrant of left female breast: Secondary | ICD-10-CM | POA: Diagnosis not present

## 2016-07-30 ENCOUNTER — Other Ambulatory Visit (HOSPITAL_COMMUNITY): Payer: Self-pay | Admitting: *Deleted

## 2016-07-30 DIAGNOSIS — C50912 Malignant neoplasm of unspecified site of left female breast: Secondary | ICD-10-CM

## 2016-07-31 ENCOUNTER — Other Ambulatory Visit (HOSPITAL_COMMUNITY): Payer: Medicare HMO

## 2016-07-31 ENCOUNTER — Ambulatory Visit (HOSPITAL_COMMUNITY): Payer: Medicare HMO

## 2016-08-05 DIAGNOSIS — C50212 Malignant neoplasm of upper-inner quadrant of left female breast: Secondary | ICD-10-CM | POA: Diagnosis not present

## 2016-08-05 DIAGNOSIS — Z510 Encounter for antineoplastic radiation therapy: Secondary | ICD-10-CM | POA: Diagnosis not present

## 2016-08-11 DIAGNOSIS — C50912 Malignant neoplasm of unspecified site of left female breast: Secondary | ICD-10-CM | POA: Diagnosis not present

## 2016-08-15 ENCOUNTER — Encounter (HOSPITAL_COMMUNITY): Payer: PPO

## 2016-08-18 DIAGNOSIS — C50212 Malignant neoplasm of upper-inner quadrant of left female breast: Secondary | ICD-10-CM | POA: Diagnosis not present

## 2016-08-26 DIAGNOSIS — C50212 Malignant neoplasm of upper-inner quadrant of left female breast: Secondary | ICD-10-CM | POA: Diagnosis not present

## 2016-09-02 DIAGNOSIS — C50412 Malignant neoplasm of upper-outer quadrant of left female breast: Secondary | ICD-10-CM | POA: Diagnosis not present

## 2016-09-02 DIAGNOSIS — Z510 Encounter for antineoplastic radiation therapy: Secondary | ICD-10-CM | POA: Diagnosis not present

## 2016-09-02 DIAGNOSIS — C50212 Malignant neoplasm of upper-inner quadrant of left female breast: Secondary | ICD-10-CM | POA: Diagnosis not present

## 2016-09-08 DIAGNOSIS — C50412 Malignant neoplasm of upper-outer quadrant of left female breast: Secondary | ICD-10-CM | POA: Diagnosis not present

## 2016-09-09 DIAGNOSIS — Z713 Dietary counseling and surveillance: Secondary | ICD-10-CM | POA: Diagnosis not present

## 2016-09-09 DIAGNOSIS — C50912 Malignant neoplasm of unspecified site of left female breast: Secondary | ICD-10-CM | POA: Diagnosis not present

## 2016-09-09 DIAGNOSIS — F419 Anxiety disorder, unspecified: Secondary | ICD-10-CM | POA: Diagnosis not present

## 2016-09-09 DIAGNOSIS — I10 Essential (primary) hypertension: Secondary | ICD-10-CM | POA: Diagnosis not present

## 2016-09-09 DIAGNOSIS — I639 Cerebral infarction, unspecified: Secondary | ICD-10-CM | POA: Diagnosis not present

## 2016-09-09 DIAGNOSIS — Z6832 Body mass index (BMI) 32.0-32.9, adult: Secondary | ICD-10-CM | POA: Diagnosis not present

## 2016-09-09 DIAGNOSIS — Z299 Encounter for prophylactic measures, unspecified: Secondary | ICD-10-CM | POA: Diagnosis not present

## 2016-09-09 DIAGNOSIS — F17210 Nicotine dependence, cigarettes, uncomplicated: Secondary | ICD-10-CM | POA: Diagnosis not present

## 2016-09-09 DIAGNOSIS — E7800 Pure hypercholesterolemia, unspecified: Secondary | ICD-10-CM | POA: Diagnosis not present

## 2016-09-10 DIAGNOSIS — C50412 Malignant neoplasm of upper-outer quadrant of left female breast: Secondary | ICD-10-CM | POA: Diagnosis not present

## 2016-09-16 DIAGNOSIS — L819 Disorder of pigmentation, unspecified: Secondary | ICD-10-CM | POA: Diagnosis not present

## 2016-09-16 DIAGNOSIS — Z510 Encounter for antineoplastic radiation therapy: Secondary | ICD-10-CM | POA: Diagnosis not present

## 2016-09-16 DIAGNOSIS — Z170 Estrogen receptor positive status [ER+]: Secondary | ICD-10-CM | POA: Diagnosis not present

## 2016-09-16 DIAGNOSIS — C50412 Malignant neoplasm of upper-outer quadrant of left female breast: Secondary | ICD-10-CM | POA: Diagnosis not present

## 2016-10-01 ENCOUNTER — Telehealth (HOSPITAL_COMMUNITY): Payer: Self-pay | Admitting: Emergency Medicine

## 2016-10-01 NOTE — Telephone Encounter (Signed)
Left message for pt to call me back about re-scheduling a follow-up visit at [phone].

## 2016-10-16 DIAGNOSIS — G5600 Carpal tunnel syndrome, unspecified upper limb: Secondary | ICD-10-CM | POA: Diagnosis not present

## 2016-10-16 DIAGNOSIS — E7800 Pure hypercholesterolemia, unspecified: Secondary | ICD-10-CM | POA: Diagnosis not present

## 2016-10-16 DIAGNOSIS — Z299 Encounter for prophylactic measures, unspecified: Secondary | ICD-10-CM | POA: Diagnosis not present

## 2016-10-16 DIAGNOSIS — I639 Cerebral infarction, unspecified: Secondary | ICD-10-CM | POA: Diagnosis not present

## 2016-10-16 DIAGNOSIS — R350 Frequency of micturition: Secondary | ICD-10-CM | POA: Diagnosis not present

## 2016-10-16 DIAGNOSIS — I10 Essential (primary) hypertension: Secondary | ICD-10-CM | POA: Diagnosis not present

## 2016-10-16 DIAGNOSIS — Z713 Dietary counseling and surveillance: Secondary | ICD-10-CM | POA: Diagnosis not present

## 2016-10-16 DIAGNOSIS — C50912 Malignant neoplasm of unspecified site of left female breast: Secondary | ICD-10-CM | POA: Diagnosis not present

## 2016-10-16 DIAGNOSIS — Z6832 Body mass index (BMI) 32.0-32.9, adult: Secondary | ICD-10-CM | POA: Diagnosis not present

## 2016-10-16 DIAGNOSIS — F17210 Nicotine dependence, cigarettes, uncomplicated: Secondary | ICD-10-CM | POA: Diagnosis not present

## 2016-10-17 DIAGNOSIS — C50412 Malignant neoplasm of upper-outer quadrant of left female breast: Secondary | ICD-10-CM | POA: Diagnosis not present

## 2016-10-17 DIAGNOSIS — Z170 Estrogen receptor positive status [ER+]: Secondary | ICD-10-CM | POA: Diagnosis not present

## 2016-10-17 DIAGNOSIS — Z510 Encounter for antineoplastic radiation therapy: Secondary | ICD-10-CM | POA: Diagnosis not present

## 2016-10-17 DIAGNOSIS — L819 Disorder of pigmentation, unspecified: Secondary | ICD-10-CM | POA: Diagnosis not present

## 2016-10-25 DIAGNOSIS — Z87891 Personal history of nicotine dependence: Secondary | ICD-10-CM | POA: Diagnosis not present

## 2016-10-25 DIAGNOSIS — Z853 Personal history of malignant neoplasm of breast: Secondary | ICD-10-CM | POA: Diagnosis not present

## 2016-10-25 DIAGNOSIS — Z7902 Long term (current) use of antithrombotics/antiplatelets: Secondary | ICD-10-CM | POA: Diagnosis not present

## 2016-10-25 DIAGNOSIS — I10 Essential (primary) hypertension: Secondary | ICD-10-CM | POA: Diagnosis not present

## 2016-10-25 DIAGNOSIS — R569 Unspecified convulsions: Secondary | ICD-10-CM | POA: Diagnosis not present

## 2016-10-25 DIAGNOSIS — R200 Anesthesia of skin: Secondary | ICD-10-CM | POA: Diagnosis not present

## 2016-10-25 DIAGNOSIS — E1165 Type 2 diabetes mellitus with hyperglycemia: Secondary | ICD-10-CM | POA: Diagnosis not present

## 2016-10-25 DIAGNOSIS — I252 Old myocardial infarction: Secondary | ICD-10-CM | POA: Diagnosis not present

## 2016-10-25 DIAGNOSIS — I6789 Other cerebrovascular disease: Secondary | ICD-10-CM | POA: Diagnosis not present

## 2016-10-25 DIAGNOSIS — I69354 Hemiplegia and hemiparesis following cerebral infarction affecting left non-dominant side: Secondary | ICD-10-CM | POA: Diagnosis not present

## 2016-10-25 DIAGNOSIS — R202 Paresthesia of skin: Secondary | ICD-10-CM | POA: Diagnosis not present

## 2016-10-25 DIAGNOSIS — R739 Hyperglycemia, unspecified: Secondary | ICD-10-CM | POA: Diagnosis not present

## 2016-10-25 DIAGNOSIS — Z79899 Other long term (current) drug therapy: Secondary | ICD-10-CM | POA: Diagnosis not present

## 2016-10-25 DIAGNOSIS — Z7982 Long term (current) use of aspirin: Secondary | ICD-10-CM | POA: Diagnosis not present

## 2016-10-31 ENCOUNTER — Other Ambulatory Visit: Payer: Self-pay

## 2016-10-31 NOTE — Patient Outreach (Signed)
[facility]) Care Management  10/31/2016  ADASYN MCADAMS 11/30/1955 [id number]  Transition of care  Referral date: 10/30/16 Referral source: Utilization management Insurance: Health team advantage  Telephone call to patient for transition of care follow up. Unable to reach patient or leave voice message on listed mobile number. Mailbox is full  PLAN: RNCM will attempt 2nd telephone call to patient within  3 business days.   Quinn Plowman RN,[name],CCM [place] Telephonic  [phone]

## 2016-11-03 ENCOUNTER — Ambulatory Visit: Payer: Self-pay

## 2016-11-03 ENCOUNTER — Ambulatory Visit (HOSPITAL_COMMUNITY): Payer: PPO

## 2016-11-06 ENCOUNTER — Other Ambulatory Visit: Payer: Self-pay | Admitting: *Deleted

## 2016-11-06 NOTE — Patient Outreach (Signed)
[place]) Care Management  11/06/2016  Julie Parker [birth year] [id number]   Care Coordination  Phone call placed to Drue Dun, SW for [name] during recent hospital stay. [name] stated, patient was d/c from hospital last week. [name] reported, patient plan of care was to be discharged to a group home. Parke Simmers stated, when the group home arrived to pick-up the patient; the patient changed her mind. The patient decided to be discharged home due to not wanting to give up her check. Parke Simmers voiced, the patient said, she has an irresponsible son and she has to pay his monthly truck payment. The patient didn't want her son to lose his truck, per [name]. The patient discharged home to previous environment/situation. Parke Simmers stated, she contacted APS. Per Parke Simmers, the APS said the patient is in her right mind and they couldn't force the patient to do anything. Patient was d/c to home.  Lake Bells, RN, [name], [name], [place] Telephonic Care Manager Coordinator [facility] Direct Phone: [phone] Toll Free: [phone] Fax: [phone]

## 2016-11-06 NOTE — Patient Outreach (Addendum)
/  [place] [facility]) Care Management  11/06/2016  Julie Parker/ [birth year] [id number]   Transition of Care Referral  Referral Date: 10/31/16 Referral Source: Utilization management Date of Discharge: Last week, [name], RN, [name], [name], [name]

## 2016-11-07 DIAGNOSIS — I679 Cerebrovascular disease, unspecified: Secondary | ICD-10-CM | POA: Diagnosis not present

## 2016-11-07 DIAGNOSIS — F17210 Nicotine dependence, cigarettes, uncomplicated: Secondary | ICD-10-CM | POA: Diagnosis not present

## 2016-11-07 DIAGNOSIS — C50912 Malignant neoplasm of unspecified site of left female breast: Secondary | ICD-10-CM | POA: Diagnosis not present

## 2016-11-07 DIAGNOSIS — Z299 Encounter for prophylactic measures, unspecified: Secondary | ICD-10-CM | POA: Diagnosis not present

## 2016-11-07 DIAGNOSIS — E7800 Pure hypercholesterolemia, unspecified: Secondary | ICD-10-CM | POA: Diagnosis not present

## 2016-11-07 DIAGNOSIS — F419 Anxiety disorder, unspecified: Secondary | ICD-10-CM | POA: Diagnosis not present

## 2016-11-07 DIAGNOSIS — E1165 Type 2 diabetes mellitus with hyperglycemia: Secondary | ICD-10-CM | POA: Diagnosis not present

## 2016-11-07 DIAGNOSIS — Z6831 Body mass index (BMI) 31.0-31.9, adult: Secondary | ICD-10-CM | POA: Diagnosis not present

## 2016-11-07 DIAGNOSIS — I10 Essential (primary) hypertension: Secondary | ICD-10-CM | POA: Diagnosis not present

## 2016-11-07 DIAGNOSIS — Z713 Dietary counseling and surveillance: Secondary | ICD-10-CM | POA: Diagnosis not present

## 2016-11-07 DIAGNOSIS — I639 Cerebral infarction, unspecified: Secondary | ICD-10-CM | POA: Diagnosis not present

## 2016-11-10 ENCOUNTER — Ambulatory Visit: Payer: Self-pay | Admitting: *Deleted

## 2016-11-10 ENCOUNTER — Encounter: Payer: Self-pay | Admitting: *Deleted

## 2016-11-10 ENCOUNTER — Other Ambulatory Visit: Payer: Self-pay | Admitting: *Deleted

## 2016-11-10 NOTE — Patient Outreach (Signed)
[facility]) Care Management  11/10/2016  AINE STRYCHARZ Apr 25, 1956 [id number]  Transition of Care Referral  Referral Date: 10/31/16 Referral Source: Utilization management Date of Discharge: Last week, per Drue Dun (SW) Facility: N/A Discharge Diagnosis: N/A Insurance: HTA  Outreach attempt #3 to patient. No answer.Unable to leave message. Telephone number is invalid.  Plan: RN CM will send unsuccessful outreach letter to patient and will close case if no response from patient within 10 business days.  Lake Bells, RN, [name], [name], [place] Telephonic Care Manager Coordinator [facility] Direct Phone: [phone] Toll Free: [phone] Fax: [phone]

## 2016-11-14 DIAGNOSIS — I10 Essential (primary) hypertension: Secondary | ICD-10-CM | POA: Diagnosis not present

## 2016-11-14 DIAGNOSIS — Z299 Encounter for prophylactic measures, unspecified: Secondary | ICD-10-CM | POA: Diagnosis not present

## 2016-11-14 DIAGNOSIS — I679 Cerebrovascular disease, unspecified: Secondary | ICD-10-CM | POA: Diagnosis not present

## 2016-11-14 DIAGNOSIS — I639 Cerebral infarction, unspecified: Secondary | ICD-10-CM | POA: Diagnosis not present

## 2016-11-14 DIAGNOSIS — E7800 Pure hypercholesterolemia, unspecified: Secondary | ICD-10-CM | POA: Diagnosis not present

## 2016-11-14 DIAGNOSIS — Z6831 Body mass index (BMI) 31.0-31.9, adult: Secondary | ICD-10-CM | POA: Diagnosis not present

## 2016-11-14 DIAGNOSIS — F419 Anxiety disorder, unspecified: Secondary | ICD-10-CM | POA: Diagnosis not present

## 2016-11-14 DIAGNOSIS — E1165 Type 2 diabetes mellitus with hyperglycemia: Secondary | ICD-10-CM | POA: Diagnosis not present

## 2016-11-14 DIAGNOSIS — C50912 Malignant neoplasm of unspecified site of left female breast: Secondary | ICD-10-CM | POA: Diagnosis not present

## 2016-11-24 ENCOUNTER — Other Ambulatory Visit: Payer: Self-pay | Admitting: *Deleted

## 2016-11-24 ENCOUNTER — Encounter (HOSPITAL_COMMUNITY): Payer: Self-pay

## 2016-11-24 ENCOUNTER — Encounter (HOSPITAL_COMMUNITY): Payer: PPO | Attending: Oncology | Admitting: Oncology

## 2016-11-24 DIAGNOSIS — C50412 Malignant neoplasm of upper-outer quadrant of left female breast: Secondary | ICD-10-CM

## 2016-11-24 DIAGNOSIS — C50212 Malignant neoplasm of upper-inner quadrant of left female breast: Secondary | ICD-10-CM

## 2016-11-24 DIAGNOSIS — Z720 Tobacco use: Secondary | ICD-10-CM

## 2016-11-24 DIAGNOSIS — C50912 Malignant neoplasm of unspecified site of left female breast: Secondary | ICD-10-CM

## 2016-11-24 DIAGNOSIS — Z8673 Personal history of transient ischemic attack (TIA), and cerebral infarction without residual deficits: Secondary | ICD-10-CM

## 2016-11-24 MED ORDER — CALCIUM CARBONATE-VITAMIN D 500-200 MG-UNIT PO TABS: 2.0000 | ORAL_TABLET | Freq: Every day | ORAL | 5 refills | Status: AC

## 2016-11-24 MED ORDER — ANASTROZOLE 1 MG PO TABS: 1.0000 mg | ORAL_TABLET | Freq: Every day | ORAL | 2 refills | Status: DC

## 2016-11-24 NOTE — Patient Outreach (Addendum)
[facility]) Care Management  11/24/2016  Julie Parker 1955/08/09 [id number]   Transition of Care  RN CM will sent unsuccessful outreach letter to patient. Closing case due to no response from patient within 10 business days. Unable to reach patient. Case closed.  Plan: RN CM will notify [facility] CM administrative assistant regarding case closure.    Lake Bells, RN, [name], [name], [place] Telephonic Care Manager Coordinator [facility] Direct Phone: [phone] Toll Free: [phone] Fax: [phone]

## 2016-11-24 NOTE — Progress Notes (Signed)
[place]:  Medical Oncology/Hematology  PCP:  System, Pcp Not In  REASON FOR VISIT:  Consideration for next cycle chemotherapy for Stage IIB multifocal left breast cancer, ER+/PR+/HER2-  CURRENT THERAPY:  CMF (Cyclophosphamide, Methotrexate, 5-FU), cycle #6 due today    BRIEF ONCOLOGIC HISTORY:  Oncology History   Multifocal L breast cancer     Cancer of left breast, stage 2 ([place])   07/04/2015 Mammogram    Digital mammography reveals 3 abnormal areas in the left breast, 2:00 position, 1.6 x 1.4 cm palpable mass, 10:00 position 1.7 x 1.4 cm palpable mass, upper outer quadrant 1.5-1.1 pleomorphic calcifications        07/04/2015 Initial Biopsy    Biopsies reveals invasive ductal carcinoma at the 2:00 position in the 10:00 position, ER positive greater than 90%, PR +1-5%, HER-2 negative Ki-67 marker 26%, the second tumor greater than 90% ER positive, 80% PR positive, HER-2 negative, Ki-67 marker 24%          08/17/2015 Imaging    MRI breast: 1.4 cm area of nodular and linear enhancement in the upper inner quadrant of the LEFT breast suspicious for ductal carcinoma in situ and possible invasive ductal carcinoma. There are no findings suspicious for malignancy in the right breast. 2.1 cm biopsy-proven invasive ductal carcinoma in the 10 o'clock position of the left breast. 1.7 cm biopsy-proven invasive ductal carcinoma in the 2 o'clock position of the left breast       12/13/2015 Initial Diagnosis    Breast cancer, left ([place])     12/13/2015 Surgery    Multifocal L breast cancer, L simple mastectomy with L sentinel LN mapping      12/18/2015 Pathology Results    Invasive ductal adenocarcinoma, grade 3, spanning 2.6 cm and 1.9 cm. Ductal carcinoma in situ, grade 3. Lymphovascular invasion identified. Margins of resection were negative for carcinoma. 1/1 metastatic ductal adenocarcinoma in left axillary sentinel lymph node biopsy.      12/18/2015  Pathology Results    ER 100% positive, PR 100% positive, KI-67 15%, HER-2 negative.      02/22/2016 - 06/20/2016 Chemotherapy    The patient had palonosetron (ALOXI) injection 0.25 mg, 0.25 mg, Intravenous,  Once, 3 of 6 cycles  methotrexate (PF) chemo injection 82 mg, 40 mg/m2 = 82 mg, Intravenous,  Once, 3 of 6 cycles  pegfilgrastim (NEULASTA) injection 6 mg, 6 mg, Subcutaneous, Once, 3 of 6 cycles  pegfilgrastim (NEULASTA ONPRO KIT) injection 6 mg, 6 mg, Subcutaneous, Once, 2 of 2 cycles  cyclophosphamide (CYTOXAN) 1,240 mg in sodium chloride 0.9 % 250 mL chemo infusion, 600 mg/m2 = 1,240 mg, Intravenous,  Once, 3 of 6 cycles  fluorouracil (ADRUCIL) chemo injection 1,250 mg, 600 mg/m2 = 1,250 mg, Intravenous,  Once, 3 of 6 cycles  for chemotherapy treatment.         INTERVAL HISTORY:  Julie Parker 61 y.o. female is here for ongoing follow-up of multifocal carcinoma of the L breast. Disease is ER+ PR+ (100%, 5%) and HER 2 -. She has undergone left mastectomy with Dr. Brantley Stage. She had previously seen Dr. Tressie Stalker in consultation. Mammaprint noted to be high risk.   Patient presents today for continued follow-up. She has been lost to [place] for follow-up. She completed radiation in May 2018. She states that she tolerated adjuvant radiation well. She denies any complaints today. She denies any chest pain, shortness breath, abdominal pain, changes in appetite, weight loss, fatigue.  MEDICAL HISTORY:  Past Medical History:  Diagnosis Date  . Arthritis   . Cancer [facility])    left breast cancer  . Cancer of left breast, stage 2 ([place]) 12/13/2015  . Diabetes mellitus without complication ([place])   . Hypertension   . Pneumonia   . Seizures ([place])   . Stroke [facility])    2 strokes, right side weakness    SURGICAL HISTORY: Past Surgical History:  Procedure Laterality Date  . ABDOMINAL HYSTERECTOMY    . FOOT SURGERY Right   . PORTACATH PLACEMENT Right 02/14/2016   Procedure: INSERTION  PORT-A-CATH;  Surgeon: Vickie Epley, MD;  Location: [facility];  Service: Vascular;  Laterality: Right;  . SIMPLE MASTECTOMY WITH AXILLARY SENTINEL NODE BIOPSY Left 12/13/2015   Procedure: LEFT SIMPLE MASTECTOMY WITH AXILLARY SENTINEL NODE BIOPSY;  Surgeon: Erroll Luna, MD;  Location: [place];  Service: General;  Laterality: Left;  . TUBAL LIGATION      SOCIAL HISTORY: Social History   Social History  . Marital status: Widowed    Spouse name: N/A  . Number of children: N/A  . Years of education: N/A   Occupational History  . Not on file.   Social History Main Topics  . Smoking status: Current Every Day Smoker    Packs/day: 0.25    Years: 20.00    Types: Cigarettes  . Smokeless tobacco: Never Used  . Alcohol use No  . Drug use: No  . Sexual activity: Not Currently    Birth control/ protection: Surgical   Other Topics Concern  . Not on file   Social History Narrative  . No narrative on file   She currently smokes but is trying to quit. She started smoking when she was about 21. She smokes 4-5 packs a day.  She does not drink alcohol.  She enjoys playing sports, playing spades, watching tv. She does not work because she is disabled. She has four brother and three sisters.   FAMILY HISTORY: Family History  Problem Relation Age of Onset  . Hypertension Mother   . Diabetes Mother   . Other Father        sleeping disease   Dad passed away in his sleep around age 36 years old.  Mom is still living. She is about 71 years old. Mother ([name])   ALLERGIES:   Allergies  Allergen Reactions  . No Known Allergies Other (See Comments)     MEDICATIONS:  Current Outpatient Prescriptions  Medication Sig Dispense Refill  . aspirin 81 MG EC tablet Take 1 tablet by mouth daily.    Marland Kitchen atenolol (TENORMIN) 50 MG tablet Take 1 tablet by mouth daily.    . baclofen (LIORESAL) 10 MG tablet Take 1 tablet by mouth 3 (three) times daily as needed for muscle spasms.     .  benazepril (LOTENSIN) 40 MG tablet     . chlorthalidone (HYGROTON) 25 MG tablet Take 1 tablet by mouth daily.    . clopidogrel (PLAVIX) 75 MG tablet Take 1 tablet by mouth daily.    . CYCLOPHOSPHAMIDE IV Inject into the vein. Every 21 days    . dexamethasone (DECADRON) 4 MG tablet Take 2 tablets (8 mg total) by mouth daily. Start the day after chemotherapy for 2 days. Take with food. 30 tablet 1  . fluorouracil (ADRUCIL) 2.5 GM/50ML SOLN Inject into the vein once. Injection every 21 days    . lidocaine-prilocaine (EMLA) cream Apply to affected area once 30 g 3  .  LORazepam (ATIVAN) 0.5 MG tablet Take 1 tablet by mouth daily as needed for anxiety.     . methotrexate (PF) in sodium chloride 0.9 % 500 mL Inject into the vein once. Every 21 days    . ondansetron (ZOFRAN) 8 MG tablet Take 1 tablet (8 mg total) by mouth 2 (two) times daily as needed for refractory nausea / vomiting. Start on day 3 after chemotherapy. 30 tablet 1  . ondansetron (ZOFRAN-ODT) 4 MG disintegrating tablet Take 1 tablet (4 mg total) by mouth every 6 (six) hours as needed for nausea. 20 tablet 0  . oxyCODONE (OXY IR/ROXICODONE) 5 MG immediate release tablet Take 1-2 tablets (5-10 mg total) by mouth every 4 (four) hours as needed for moderate pain. 30 tablet 0  . potassium chloride (K-DUR) 10 MEQ tablet Take 1 tablet (10 mEq total) by mouth 3 (three) times daily. 60 tablet 3  . prochlorperazine (COMPAZINE) 10 MG tablet Take 1 tablet (10 mg total) by mouth every 6 (six) hours as needed (Nausea or vomiting). 30 tablet 1   No current facility-administered medications for this visit.     Review of Systems  Constitutional: Negative.  Negative for chills and fever.  HENT: Negative.  Negative for nosebleeds and sore throat.   Eyes: Negative.   Respiratory: Negative.  Negative for cough and shortness of breath.   Cardiovascular: Negative.  Negative for chest pain and leg swelling.  Gastrointestinal: Negative.  Negative for  abdominal pain, blood in stool, constipation, diarrhea, nausea and vomiting.  Genitourinary: Negative for dysuria, frequency and hematuria.  Musculoskeletal: Negative.   Skin: Negative.  Negative for rash.  Neurological: Positive for sensory change, speech change and focal weakness.       Chronic issues (h/o stroke)   Endo/Heme/Allergies: Negative.   Psychiatric/Behavioral: The patient has insomnia (chronic).   All other systems reviewed and are negative. 14 point ROS was done and is otherwise as detailed above or in HPI    PHYSICAL EXAMINATION: ECOG PERFORMANCE STATUS: 1 - Symptomatic but completely ambulatory   blood pressure 115/78 pulse 87, respiratory rate 16 temperature 98.2 O2 sat 98%  Physical Exam  Constitutional: She is oriented to person, place, and time and well-developed, well-nourished, and in no distress.  HENT:  Head: Normocephalic and atraumatic.  Nose: Nose normal.  Mouth/Throat: Oropharynx is clear and moist. No oropharyngeal exudate.  Eyes: Pupils are equal, round, and reactive to light. Conjunctivae are normal. No scleral icterus.  Neck: Normal range of motion.  Cardiovascular: Normal rate, regular rhythm and normal heart sounds.   Pulmonary/Chest: Effort normal and breath sounds normal. She has no wheezes. She has no rales.  Abdominal: Soft. Bowel sounds are normal. She exhibits no distension. There is no tenderness. There is no rebound.  Musculoskeletal: Normal range of motion. She exhibits no edema.  Lymphadenopathy:    She has no cervical adenopathy.  Neurological: She is alert and oriented to person, place, and time. No cranial nerve deficit. She exhibits abnormal muscle tone. Coordination abnormal.  Weakness of both RUE and RLE d/t history of CVA; ambulates with cane   Skin: Skin is warm and dry. No rash noted.  Psychiatric: Mood, memory, affect and judgment normal.  Nursing note and vitals reviewed.    LABORATORY DATA:  I have reviewed the data as  listed Lab Results  Component Value Date   WBC 6.6 06/19/2016   HGB 14.1 06/19/2016   HCT 38.9 06/19/2016   MCV 87.0 06/19/2016   PLT 211  06/19/2016   CMP     Component Value Date/Time   NA 137 06/19/2016 1018   K 3.6 06/19/2016 1018   CL 104 06/19/2016 1018   CO2 26 06/19/2016 1018   GLUCOSE 156 (H) 06/19/2016 1018   BUN 13 06/19/2016 1018   CREATININE 0.88 06/19/2016 1018   CALCIUM 9.0 06/19/2016 1018   PROT 7.1 06/19/2016 1018   ALBUMIN 3.7 06/19/2016 1018   AST 18 06/19/2016 1018   ALT 19 06/19/2016 1018   ALKPHOS 123 06/19/2016 1018   BILITOT 0.7 06/19/2016 1018   GFRNONAA >60 06/19/2016 1018   GFRAA >60 06/19/2016 1018     RADIOGRAPHIC STUDIES: I have personally reviewed the radiological reports as listed.     CLINICAL DATA:  Status post central venous catheter placement  EXAM: PORTABLE CHEST 1 VIEW  COMPARISON:  01/30/2016  FINDINGS: There is a right chest wall port a catheter with tip in the projection of the SVC. There is no pneumothorax identified after port placement. Normal heart size. No pleural effusion or edema identified. No airspace consolidation.  IMPRESSION: 1. No complications following Port-A-Cath placement.   Electronically Signed   By: Kerby Moors M.D.   On: 02/14/2016 10:54     PATHOLOGY:            ASSESSMENT & PLAN:  Multifocal carcinoma L breast ER+ PR+ ( 5%, 100%) HER 2 -  Carcinoma of the upper outer quadrant of the L breast Carcinoma of the upper inner quadrant of the L breast [id number] High Risk Mammaprint HX CVA with R sided weakness Hysterectomy Tobacco Use Hypokalemia  Stage IIB multifocal (L) breast cancer:  -Discussed endocrine therapy with anastrozole with the patient and reviewed side effects. She is willing to proceed. Rx sent to her pharmacy. - Also discussed calcium-vitamin D to prevent decrease in bone density while on AI and patient agreed to take it. -Ordered baseline DEXA  scan. -RTC in 3 months for follow up with labs.     Orders Placed This Encounter  Procedures  . DG Bone Density    Standing Status:   Future    Standing Expiration Date:   11/24/2017    Order Specific Question:   Reason for Exam (SYMPTOM  OR DIAGNOSIS REQUIRED)    Answer:   rule out osteopenia prior to starting aromatase inhibitor    Order Specific Question:   Is the patient pregnant?    Answer:   Yes    Order Specific Question:   Preferred imaging location?    Answer:   [facility]  . CBC with Differential    Standing Status:   Future    Standing Expiration Date:   11/24/2017  . Comprehensive metabolic panel    Standing Status:   Future    Standing Expiration Date:   11/24/2017    All questions were answered. The patient knows to call the clinic with any problems, questions or concerns.  Twana First, MD

## 2016-11-27 DIAGNOSIS — Z299 Encounter for prophylactic measures, unspecified: Secondary | ICD-10-CM | POA: Diagnosis not present

## 2016-11-27 DIAGNOSIS — I10 Essential (primary) hypertension: Secondary | ICD-10-CM | POA: Diagnosis not present

## 2016-11-27 DIAGNOSIS — Z6830 Body mass index (BMI) 30.0-30.9, adult: Secondary | ICD-10-CM | POA: Diagnosis not present

## 2016-11-27 DIAGNOSIS — C50912 Malignant neoplasm of unspecified site of left female breast: Secondary | ICD-10-CM | POA: Diagnosis not present

## 2016-11-27 DIAGNOSIS — E7800 Pure hypercholesterolemia, unspecified: Secondary | ICD-10-CM | POA: Diagnosis not present

## 2016-11-27 DIAGNOSIS — I639 Cerebral infarction, unspecified: Secondary | ICD-10-CM | POA: Diagnosis not present

## 2016-12-05 ENCOUNTER — Other Ambulatory Visit (HOSPITAL_COMMUNITY): Payer: PPO

## 2016-12-16 ENCOUNTER — Ambulatory Visit (HOSPITAL_COMMUNITY): Payer: PPO

## 2016-12-31 DIAGNOSIS — F17210 Nicotine dependence, cigarettes, uncomplicated: Secondary | ICD-10-CM | POA: Diagnosis not present

## 2016-12-31 DIAGNOSIS — Z713 Dietary counseling and surveillance: Secondary | ICD-10-CM | POA: Diagnosis not present

## 2016-12-31 DIAGNOSIS — Z299 Encounter for prophylactic measures, unspecified: Secondary | ICD-10-CM | POA: Diagnosis not present

## 2016-12-31 DIAGNOSIS — E7800 Pure hypercholesterolemia, unspecified: Secondary | ICD-10-CM | POA: Diagnosis not present

## 2016-12-31 DIAGNOSIS — E1165 Type 2 diabetes mellitus with hyperglycemia: Secondary | ICD-10-CM | POA: Diagnosis not present

## 2016-12-31 DIAGNOSIS — Z6830 Body mass index (BMI) 30.0-30.9, adult: Secondary | ICD-10-CM | POA: Diagnosis not present

## 2016-12-31 DIAGNOSIS — I10 Essential (primary) hypertension: Secondary | ICD-10-CM | POA: Diagnosis not present

## 2016-12-31 DIAGNOSIS — C50912 Malignant neoplasm of unspecified site of left female breast: Secondary | ICD-10-CM | POA: Diagnosis not present

## 2016-12-31 DIAGNOSIS — I639 Cerebral infarction, unspecified: Secondary | ICD-10-CM | POA: Diagnosis not present

## 2017-01-09 ENCOUNTER — Encounter (HOSPITAL_COMMUNITY): Payer: PPO

## 2017-01-09 ENCOUNTER — Encounter (HOSPITAL_COMMUNITY): Payer: PPO | Attending: Oncology | Admitting: Adult Health

## 2017-01-09 ENCOUNTER — Encounter (HOSPITAL_COMMUNITY): Payer: Self-pay | Admitting: Adult Health

## 2017-01-09 VITALS — BP 95/65 | HR 82 | Resp 18 | Ht 65.5 in | Wt 184.0 lb

## 2017-01-09 DIAGNOSIS — G620 Drug-induced polyneuropathy: Secondary | ICD-10-CM

## 2017-01-09 DIAGNOSIS — Z720 Tobacco use: Secondary | ICD-10-CM

## 2017-01-09 DIAGNOSIS — Z95828 Presence of other vascular implants and grafts: Secondary | ICD-10-CM

## 2017-01-09 DIAGNOSIS — C50912 Malignant neoplasm of unspecified site of left female breast: Secondary | ICD-10-CM | POA: Insufficient documentation

## 2017-01-09 DIAGNOSIS — Z1231 Encounter for screening mammogram for malignant neoplasm of breast: Secondary | ICD-10-CM | POA: Diagnosis not present

## 2017-01-09 DIAGNOSIS — Z8673 Personal history of transient ischemic attack (TIA), and cerebral infarction without residual deficits: Secondary | ICD-10-CM | POA: Diagnosis not present

## 2017-01-09 LAB — CBC WITH DIFFERENTIAL/PLATELET
Basophils Absolute: 0 10*3/uL (ref 0.0–0.1)
Basophils Relative: 1 %
EOS ABS: 0.2 10*3/uL (ref 0.0–0.7)
EOS PCT: 3 %
HCT: 36.1 % (ref 36.0–46.0)
Hemoglobin: 12.9 g/dL (ref 12.0–15.0)
Lymphocytes Relative: 32 %
Lymphs Abs: 1.8 10*3/uL (ref 0.7–4.0)
MCH: 30.3 pg (ref 26.0–34.0)
MCHC: 35.7 g/dL (ref 30.0–36.0)
MCV: 84.7 fL (ref 78.0–100.0)
Monocytes Absolute: 0.5 10*3/uL (ref 0.1–1.0)
Monocytes Relative: 9 %
Neutro Abs: 3.1 10*3/uL (ref 1.7–7.7)
Neutrophils Relative %: 55 %
PLATELETS: 339 10*3/uL (ref 150–400)
RBC: 4.26 MIL/uL (ref 3.87–5.11)
RDW: 14.1 % (ref 11.5–15.5)
WBC: 5.6 10*3/uL (ref 4.0–10.5)

## 2017-01-09 LAB — COMPREHENSIVE METABOLIC PANEL
ALT: 18 U/L (ref 14–54)
ANION GAP: 11 (ref 5–15)
AST: 19 U/L (ref 15–41)
Albumin: 4.2 g/dL (ref 3.5–5.0)
Alkaline Phosphatase: 72 U/L (ref 38–126)
BUN: 27 mg/dL — ABNORMAL HIGH (ref 6–20)
CHLORIDE: 104 mmol/L (ref 101–111)
CO2: 25 mmol/L (ref 22–32)
CREATININE: 1.11 mg/dL — AB (ref 0.44–1.00)
Calcium: 10.3 mg/dL (ref 8.9–10.3)
GFR calc Af Amer: >60 mL/min (ref >60)
GFR calc non Af Amer: 53 mL/min — ABNORMAL LOW (ref >60)
Glucose, Bld: 98 mg/dL (ref 65–99)
Potassium: 3.4 mmol/L — ABNORMAL LOW (ref 3.5–5.1)
SODIUM: 140 mmol/L (ref 135–145)
Total Bilirubin: 0.5 mg/dL (ref 0.3–1.2)
Total Protein: 7.9 g/dL (ref 6.5–8.1)

## 2017-01-09 MED ORDER — SODIUM CHLORIDE 0.9% FLUSH
10.0000 mL | INTRAVENOUS | Status: DC | PRN
  Administered 2017-01-09: 10 mL via INTRAVENOUS
  Filled 2017-01-09: qty 10

## 2017-01-09 MED ORDER — HEPARIN SOD (PORK) LOCK FLUSH 100 UNIT/ML IV SOLN
INTRAVENOUS | Status: AC
  Filled 2017-01-09: qty 5

## 2017-01-09 MED ORDER — HEPARIN SOD (PORK) LOCK FLUSH 100 UNIT/ML IV SOLN
500.0000 [IU] | Freq: Once | INTRAVENOUS | Status: AC
  Administered 2017-01-09: 500 [IU] via INTRAVENOUS

## 2017-01-09 NOTE — Progress Notes (Signed)
Julie Parker:  Medical Oncology/Hematology  PCP:  System, Pcp Not In  REASON FOR VISIT:  Routine follow-up for Stage IIB multifocal invasive ductal carcinoma of (L) breast; ER+/PR+/HER2-  CURRENT THERAPY: Anastrozole daily, beginning 11/2016    BRIEF ONCOLOGIC HISTORY:  Oncology History   Multifocal L breast cancer     Cancer of left breast, stage 2 ([place])   07/04/2015 Mammogram    Digital mammography reveals 3 abnormal areas in the left breast, 2:00 position, 1.6 x 1.4 cm palpable mass, 10:00 position 1.7 x 1.4 cm palpable mass, upper outer quadrant 1.5-1.1 pleomorphic calcifications        07/04/2015 Initial Biopsy    Biopsies reveals invasive ductal carcinoma at the 2:00 position in the 10:00 position, ER positive greater than 90%, PR +1-5%, HER-2 negative Ki-67 marker 26%, the second tumor greater than 90% ER positive, 80% PR positive, HER-2 negative, Ki-67 marker 24%          08/17/2015 Imaging    MRI breast: 1.4 cm area of nodular and linear enhancement in the upper inner quadrant of the LEFT breast suspicious for ductal carcinoma in situ and possible invasive ductal carcinoma. There are no findings suspicious for malignancy in the right breast. 2.1 cm biopsy-proven invasive ductal carcinoma in the 10 o'clock position of the left breast. 1.7 cm biopsy-proven invasive ductal carcinoma in the 2 o'clock position of the left breast       12/13/2015 Initial Diagnosis    Breast cancer, left ([place])     12/13/2015 Surgery    Multifocal L breast cancer, L simple mastectomy with L sentinel LN mapping      12/18/2015 Pathology Results    Invasive ductal adenocarcinoma, grade 3, spanning 2.6 cm and 1.9 cm. Ductal carcinoma in situ, grade 3. Lymphovascular invasion identified. Margins of resection were negative for carcinoma. 1/1 metastatic ductal adenocarcinoma in left axillary sentinel lymph node biopsy.      12/18/2015 Pathology Results    ER 100%  positive, PR 100% positive, KI-67 15%, HER-2 negative.      02/22/2016 - 06/20/2016 Chemotherapy    The patient had palonosetron (ALOXI) injection 0.25 mg, 0.25 mg, Intravenous,  Once, 3 of 6 cycles  methotrexate (PF) chemo injection 82 mg, 40 mg/m2 = 82 mg, Intravenous,  Once, 3 of 6 cycles  pegfilgrastim (NEULASTA) injection 6 mg, 6 mg, Subcutaneous, Once, 3 of 6 cycles  pegfilgrastim (NEULASTA ONPRO KIT) injection 6 mg, 6 mg, Subcutaneous, Once, 2 of 2 cycles  cyclophosphamide (CYTOXAN) 1,240 mg in sodium chloride 0.9 % 250 mL chemo infusion, 600 mg/m2 = 1,240 mg, Intravenous,  Once, 3 of 6 cycles  fluorouracil (ADRUCIL) chemo injection 1,250 mg, 600 mg/m2 = 1,250 mg, Intravenous,  Once, 3 of 6 cycles  for chemotherapy treatment.        07/29/2016 - 09/17/2016 Radiation Therapy    Adjuvant radiation therapy (done in [place] at [facility])      11/2016 -  Anti-estrogen oral therapy    Anastrozole daily. Planned duration of treatment 5-10 years.        INTERVAL HISTORY:  Julie Parker 61 y.o. female returns for routine follow-up for multifocal carcinoma of the L breast.  Overall, she tells me she has been "feeling good."  Appetite 75%; supplements her diet with Glucerna at times. Energy levels 50%.  She continues to have some peripheral neuropathy to her toes, but symptoms are stable and no worse. She reports some easy bruising,  but denies any frank bleeding episodes including blood in her stools, hematuria, or vaginal bleeding.   She completed post-mastectomy radiation in [place] on 09/17/16; onc history updated.   Remains on Anastrozole with good tolerance. Denies any vaginal dryness. She does have occasional hot flashes, "but they're not too bad."  She has chronic pain in her feet "that's a little worse", but otherwise denies any worsening arthralgias.  Chronic (R) sided weakness with history of CVA and largely stable.   She is excited to share with me that she has moved to a new  apartment in [place] where she lives alone and is really enjoying living there.  She tells me that Dr. Brigitte Pulse is her PCP and she sees him regularly per her report.    She has not had bone density imaging; she is not taking any calcium or vitamin D supplements that she is aware of.       MEDICAL HISTORY:  Past Medical History:  Diagnosis Date  . Arthritis   . Cancer [facility])    left breast cancer  . Cancer of left breast, stage 2 ([place]) 12/13/2015  . Diabetes mellitus without complication ([place])   . Hypertension   . Pneumonia   . Seizures ([place])   . Stroke [facility])    2 strokes, right side weakness    SURGICAL HISTORY: Past Surgical History:  Procedure Laterality Date  . ABDOMINAL HYSTERECTOMY    . FOOT SURGERY Right   . PORTACATH PLACEMENT Right 02/14/2016   Procedure: INSERTION PORT-A-CATH;  Surgeon: Vickie Epley, MD;  Location: [facility];  Service: Vascular;  Laterality: Right;  . SIMPLE MASTECTOMY WITH AXILLARY SENTINEL NODE BIOPSY Left 12/13/2015   Procedure: LEFT SIMPLE MASTECTOMY WITH AXILLARY SENTINEL NODE BIOPSY;  Surgeon: Erroll Luna, MD;  Location: [place];  Service: General;  Laterality: Left;  . TUBAL LIGATION      SOCIAL HISTORY: Social History   Social History  . Marital status: Widowed    Spouse name: N/A  . Number of children: N/A  . Years of education: N/A   Occupational History  . Not on file.   Social History Main Topics  . Smoking status: Current Every Day Smoker    Packs/day: 0.25    Years: 20.00    Types: Cigarettes  . Smokeless tobacco: Never Used  . Alcohol use No  . Drug use: No  . Sexual activity: Not Currently    Birth control/ protection: Surgical   Other Topics Concern  . Not on file   Social History Narrative  . No narrative on file   She currently smokes but is trying to quit. She started smoking when she was about 21.  She does not drink alcohol.  She enjoys playing sports, playing spades, watching tv. She does not work because she  is disabled. She has four brothers and three sisters.   FAMILY HISTORY: Family History  Problem Relation Age of Onset  . Hypertension Mother   . Diabetes Mother   . Other Father        sleeping disease   Dad passed away in his sleep around age 80 years old.  Mom is still living. She is about 22 years old. Mother ([name])   ALLERGIES:   Allergies  Allergen Reactions  . No Known Allergies Other (See Comments)     MEDICATIONS:  Current Outpatient Prescriptions  Medication Sig Dispense Refill  . anastrozole (ARIMIDEX) 1 MG tablet Take 1 tablet (1 mg total) by mouth daily.  90 tablet 2  . aspirin 81 MG EC tablet Take 1 tablet by mouth daily.    Marland Kitchen atenolol (TENORMIN) 50 MG tablet Take 1 tablet by mouth daily.    . baclofen (LIORESAL) 10 MG tablet Take 1 tablet by mouth 3 (three) times daily as needed for muscle spasms.     . benazepril (LOTENSIN) 40 MG tablet     . calcium-vitamin D (OSCAL WITH D) 500-200 MG-UNIT tablet Take 2 tablets by mouth daily with breakfast. 60 tablet 5  . chlorthalidone (HYGROTON) 25 MG tablet Take 1 tablet by mouth daily.    . clopidogrel (PLAVIX) 75 MG tablet Take 1 tablet by mouth daily.    . CYCLOPHOSPHAMIDE IV Inject into the vein. Every 21 days    . dexamethasone (DECADRON) 4 MG tablet Take 2 tablets (8 mg total) by mouth daily. Start the day after chemotherapy for 2 days. Take with food. 30 tablet 1  . fluorouracil (ADRUCIL) 2.5 GM/50ML SOLN Inject into the vein once. Injection every 21 days    . lidocaine-prilocaine (EMLA) cream Apply to affected area once 30 g 3  . LORazepam (ATIVAN) 0.5 MG tablet Take 1 tablet by mouth daily as needed for anxiety.     . methotrexate (PF) in sodium chloride 0.9 % 500 mL Inject into the vein once. Every 21 days    . ondansetron (ZOFRAN) 8 MG tablet Take 1 tablet (8 mg total) by mouth 2 (two) times daily as needed for refractory nausea / vomiting. Start on day 3 after chemotherapy. 30 tablet 1  . ondansetron  (ZOFRAN-ODT) 4 MG disintegrating tablet Take 1 tablet (4 mg total) by mouth every 6 (six) hours as needed for nausea. 20 tablet 0  . oxyCODONE (OXY IR/ROXICODONE) 5 MG immediate release tablet Take 1-2 tablets (5-10 mg total) by mouth every 4 (four) hours as needed for moderate pain. 30 tablet 0  . potassium chloride (K-DUR) 10 MEQ tablet Take 1 tablet (10 mEq total) by mouth 3 (three) times daily. 60 tablet 3  . prochlorperazine (COMPAZINE) 10 MG tablet Take 1 tablet (10 mg total) by mouth every 6 (six) hours as needed (Nausea or vomiting). 30 tablet 1   No current facility-administered medications for this visit.     Review of Systems  Constitutional: Positive for malaise/fatigue. Negative for chills and fever.  HENT: Negative.   Eyes: Negative.   Respiratory: Negative.  Negative for cough and shortness of breath.   Cardiovascular: Negative.  Negative for chest pain.  Gastrointestinal: Negative.  Negative for abdominal pain, blood in stool, constipation, diarrhea, nausea and vomiting.  Genitourinary: Negative.   Musculoskeletal: Positive for joint pain and myalgias.  Skin: Negative.  Negative for rash.  Neurological:       Chronic deficits s/p CVA with right-sided weakness  Endo/Heme/Allergies: Bruises/bleeds easily.  14 point ROS was done and is otherwise as detailed above or in HPI    PHYSICAL EXAMINATION: ECOG PERFORMANCE STATUS: 1-2 - Symptomatic; requires occasional assistance.   Vitals:   01/09/17 1410  BP: 95/65  Pulse: 82  Resp: 18  SpO2: 98%   Filed Weights   01/09/17 1410  Weight: 184 lb (83.5 kg)    Physical Exam  Constitutional: She is oriented to person, place, and time.  Chronically-ill appearing female in no acute distress -Exam performed with patient seated in wheelchair   HENT:  Head: Normocephalic.  Mouth/Throat: Oropharynx is clear and moist. No oropharyngeal exudate.  Eyes: Pupils are equal, round, and reactive  to light. Conjunctivae are normal. No  scleral icterus.  Neck: Normal range of motion. Neck supple.  Cardiovascular: Normal rate and regular rhythm.   Pulmonary/Chest: Effort normal. No respiratory distress.    Diminished breath sounds bilat bases   Abdominal: Soft. Bowel sounds are normal. There is no tenderness. There is no rebound.  Musculoskeletal: She exhibits edema (Trace ankle edema).  Lymphadenopathy:    She has no cervical adenopathy.  Neurological: She is alert and oriented to person, place, and time.  (R) sided weakness (chronic and stable)  Skin: Skin is warm and dry. No rash noted.  Psychiatric: Mood, memory, affect and judgment normal.  Nursing note and vitals reviewed.    LABORATORY DATA:  I have reviewed the data as listed Lab Results  Component Value Date   WBC 5.6 01/09/2017   HGB 12.9 01/09/2017   HCT 36.1 01/09/2017   MCV 84.7 01/09/2017   PLT 339 01/09/2017   CMP     Component Value Date/Time   NA 140 01/09/2017 1513   K 3.4 (L) 01/09/2017 1513   CL 104 01/09/2017 1513   CO2 25 01/09/2017 1513   GLUCOSE 98 01/09/2017 1513   BUN 27 (H) 01/09/2017 1513   CREATININE 1.11 (H) 01/09/2017 1513   CALCIUM 10.3 01/09/2017 1513   PROT 7.9 01/09/2017 1513   ALBUMIN 4.2 01/09/2017 1513   AST 19 01/09/2017 1513   ALT 18 01/09/2017 1513   ALKPHOS 72 01/09/2017 1513   BILITOT 0.5 01/09/2017 1513   GFRNONAA 53 (L) 01/09/2017 1513   GFRAA >60 01/09/2017 1513     DIAGNOSTIC IMAGING:     PATHOLOGY: (L) mastectomy surgical path: 12/13/15        Mammaprint results           ASSESSMENT & PLAN:   Stage IIB multifocal (L) breast invasive ductal carcinoma; ER+/PR+/HER2-:  -Diagnosed in 07/2015 in [place] at [facility] & followed by Dr. Tressie Stalker. Surgery was attempted in 10/2015, but her BP was poorly controlled and her surgery was subsequently cancelled for BP optimization.  On 12/13/15, she underwent (L) mastectomy by Dr. Brantley Stage. Mammaprint with high-risk score and  therefore adjuvant chemotherapy was recommended. Her medical oncology care was transferred to [facility] in 01/2016 with the closure of [facility] in [place].  Given her significant co-morbid conditions, CMF chemotherapy x 6 cycles was completed. She went on to complete post-mastectomy radiation in [place] from 07/29/16-09/17/16. Started anti-estrogen therapy in 11/2016.  -Continues on Anastrozole daily with good tolerance.  Planned duration of treatment 5-10 years. Could consider sending Breast Cancer Index (BCI) testing in the future.   -Clinical breast exam performed today and benign.  -(R) breast screening mammogram due; orders placed today.  -Return to cancer center in 3 months for follow-up.   Bone health:  -Her DEXA scan was ordered at last visit by Dr. Talbert Cage, but unfortunately did not get scheduled. I will help make sure this gets scheduled.  She understands that Anastrozole can weaken bone density over time.  -Recommended calcium/vitamin D supplementation.  She is able to walk some with a cane; weight-bearing exercises are also great for her bone health.  Depending on DEXA results, could consider starting bisphosphonate therapy in the future.   Port-a-cath maintenance:  -Port flush every 8 weeks. Explained to patient that we generally try to leave the port in place for ~1 year post-chemotherapy before making plans to have it removed. She agreed with this plan.  Dispo:  -Port flush every 8 weeks.  -DEXA scan and (R) breast mammogram sometime soon.  -Return to cancer center in 4 months for follow-up.    All questions were answered. The patient knows to call the clinic with any problems, questions or concerns.  Plan of care discussed with Dr. Talbert Cage, who agrees with the above aforementioned.    Orders placed this encounter:  Orders Placed This Encounter  Procedures  . MM SCREENING BREAST TOMO UNI R      Mike Craze, NP [place] [phone]

## 2017-01-09 NOTE — Patient Instructions (Signed)
[facility] at [facility] Discharge Instructions  RECOMMENDATIONS MADE BY THE CONSULTANT AND ANY TEST RESULTS WILL BE SENT TO YOUR REFERRING PHYSICIAN.  You had your port flushed today. Follow up as scheduled.  Thank you for choosing [facility] at [facility] to provide your oncology and hematology care.  To afford each patient quality time with our provider, please arrive at least 15 minutes before your scheduled appointment time.    If you have a lab appointment with the Cancer Center please come in thru the  Main Entrance and check in at the main information desk  You need to re-schedule your appointment should you arrive 10 or more minutes late.  We strive to give you quality time with our providers, and arriving late affects you and other patients whose appointments are after yours.  Also, if you no show three or more times for appointments you may be dismissed from the clinic at the providers discretion.     Again, thank you for choosing [facility].  Our hope is that these requests will decrease the amount of time that you wait before being seen by our physicians.       _____________________________________________________________  Should you have questions after your visit to [facility], please contact our office at [phone] between the hours of 8:30 a.m. and 4:30 p.m.  Voicemails left after 4:30 p.m. will not be returned until the following business day.  For prescription refill requests, have your pharmacy contact our office.       Resources For Cancer Patients and their Caregivers ? American Cancer Society: Can assist with transportation, wigs, general needs, runs Look Good Feel Better.        1-888-227-6333 ? Cancer Care: Provides financial assistance, online support groups, medication/co-pay assistance.  1-800-813-HOPE (4673) ? [facility] Assists [place] cancer  patients and their families through emotional , educational and financial support.  336-427-4357 ? [place] DSS Where to apply for food stamps, Medicaid and utility assistance. 336-342-1394 ? RCATS: Transportation to medical appointments. 336-347-2287 ? Social Security Administration: May apply for disability if have a Stage IV cancer. 336-342-7796 1-800-772-1213 ? [place] Aging, Disability and Transit Services: Assists with nutrition, care and transit needs. 336-349-2343  Cancer Center Support Programs: @10RELATIVEDAYS@ > Cancer Support Group  2nd Tuesday of the month 1pm-2pm, Journey Room  > Creative Journey  3rd Tuesday of the month 1130am-1pm, Journey Room  > Look Good Feel Better  1st Wednesday of the month 10am-12 noon, Journey Room (Call American Cancer Society to register 1-800-395-5775)    

## 2017-01-09 NOTE — Patient Instructions (Signed)
[place] at [facility] Discharge Instructions  RECOMMENDATIONS MADE BY THE CONSULTANT AND ANY TEST RESULTS WILL BE SENT TO YOUR REFERRING PHYSICIAN.  You were seen today by Mike Craze NP,.   Thank you for choosing [place] at [facility] to provide your oncology and hematology care.  To afford each patient quality time with our provider, please arrive at least 15 minutes before your scheduled appointment time.    If you have a lab appointment with the [place] please come in thru the  Main Entrance and check in at the main information desk  You need to re-schedule your appointment should you arrive 10 or more minutes late.  We strive to give you quality time with our providers, and arriving late affects you and other patients whose appointments are after yours.  Also, if you no show three or more times for appointments you may be dismissed from the clinic at the providers discretion.     Again, thank you for choosing [facility].  Our hope is that these requests will decrease the amount of time that you wait before being seen by our physicians.       _____________________________________________________________  Should you have questions after your visit to [facility], please contact our office at [phone] between the hours of 8:30 a.m. and 4:30 p.m.  Voicemails left after 4:30 p.m. will not be returned until the following business day.  For prescription refill requests, have your pharmacy contact our office.       Resources For Cancer Patients and their Caregivers ? American Cancer Society: Can assist with transportation, wigs, general needs, runs Look Good Feel Better.        323-681-3041 ? Cancer Care: Provides financial assistance, online support groups, medication/co-pay assistance.  1-800-813-HOPE 479-680-8035) ? [facility] Assists [place] cancer patients and  their families through emotional , educational and financial support.  361-121-2555 ? [place] DSS Where to apply for food stamps, Medicaid and utility assistance. 805-659-8359 ? RCATS: Transportation to medical appointments. 406-029-0288 ? Social Security Administration: May apply for disability if have a Stage IV cancer. (813) 764-2959 614-453-1596 ? [employer], Disability and Transit Services: Assists with nutrition, care and transit needs. [place] Support Programs: @10RELATIVEDAYS @ > Cancer Support Group  2nd Tuesday of the month 1pm-2pm, Journey Room  > Creative Journey  3rd Tuesday of the month 1130am-1pm, Journey Room  > Look Good Feel Better  1st Wednesday of the month 10am-12 noon, Journey Room (Call [place] to register [phone])

## 2017-01-09 NOTE — Progress Notes (Signed)
Patient is taking anastrozole and has not missed any doses and reports no side effects at this time.

## 2017-01-09 NOTE — Progress Notes (Signed)
Julie Parker presented for Portacath access and flush. Portacath located right chest wall accessed with  H 20 needle. No blood return but flushes without difficulty. Portacath flushed with 42ml NS and 500U/38ml Heparin and needle removed intact. Procedure without incident. Patient tolerated procedure well.  Patient discharged via wheelchair in stable condition from clinic to home. Patient to follow up as scheduled.

## 2017-01-10 NOTE — Addendum Note (Signed)
Addended by: Holley Bouche on: 01/10/2017 07:09 PM   Modules accepted: Level of Service

## 2017-01-21 ENCOUNTER — Ambulatory Visit (HOSPITAL_COMMUNITY): Payer: PPO

## 2017-01-21 ENCOUNTER — Other Ambulatory Visit (HOSPITAL_COMMUNITY): Payer: PPO

## 2017-01-22 DIAGNOSIS — Z08 Encounter for follow-up examination after completed treatment for malignant neoplasm: Secondary | ICD-10-CM | POA: Diagnosis not present

## 2017-01-22 DIAGNOSIS — Z923 Personal history of irradiation: Secondary | ICD-10-CM | POA: Diagnosis not present

## 2017-01-22 DIAGNOSIS — C50412 Malignant neoplasm of upper-outer quadrant of left female breast: Secondary | ICD-10-CM | POA: Diagnosis not present

## 2017-01-22 DIAGNOSIS — Z170 Estrogen receptor positive status [ER+]: Secondary | ICD-10-CM | POA: Diagnosis not present

## 2017-01-22 DIAGNOSIS — Z853 Personal history of malignant neoplasm of breast: Secondary | ICD-10-CM | POA: Diagnosis not present

## 2017-02-11 DIAGNOSIS — C50912 Malignant neoplasm of unspecified site of left female breast: Secondary | ICD-10-CM | POA: Diagnosis not present

## 2017-02-11 DIAGNOSIS — Z6830 Body mass index (BMI) 30.0-30.9, adult: Secondary | ICD-10-CM | POA: Diagnosis not present

## 2017-02-11 DIAGNOSIS — Z299 Encounter for prophylactic measures, unspecified: Secondary | ICD-10-CM | POA: Diagnosis not present

## 2017-02-11 DIAGNOSIS — I639 Cerebral infarction, unspecified: Secondary | ICD-10-CM | POA: Diagnosis not present

## 2017-02-11 DIAGNOSIS — E1165 Type 2 diabetes mellitus with hyperglycemia: Secondary | ICD-10-CM | POA: Diagnosis not present

## 2017-02-24 ENCOUNTER — Ambulatory Visit (HOSPITAL_COMMUNITY): Payer: PPO | Admitting: Adult Health

## 2017-02-24 ENCOUNTER — Other Ambulatory Visit (HOSPITAL_COMMUNITY): Payer: PPO

## 2017-02-26 DIAGNOSIS — I639 Cerebral infarction, unspecified: Secondary | ICD-10-CM | POA: Diagnosis not present

## 2017-02-26 DIAGNOSIS — E7800 Pure hypercholesterolemia, unspecified: Secondary | ICD-10-CM | POA: Diagnosis not present

## 2017-02-26 DIAGNOSIS — I10 Essential (primary) hypertension: Secondary | ICD-10-CM | POA: Diagnosis not present

## 2017-03-03 DIAGNOSIS — I639 Cerebral infarction, unspecified: Secondary | ICD-10-CM | POA: Diagnosis not present

## 2017-03-03 DIAGNOSIS — E7800 Pure hypercholesterolemia, unspecified: Secondary | ICD-10-CM | POA: Diagnosis not present

## 2017-03-03 DIAGNOSIS — I10 Essential (primary) hypertension: Secondary | ICD-10-CM | POA: Diagnosis not present

## 2017-03-03 DIAGNOSIS — C50912 Malignant neoplasm of unspecified site of left female breast: Secondary | ICD-10-CM | POA: Diagnosis not present

## 2017-03-03 DIAGNOSIS — Z299 Encounter for prophylactic measures, unspecified: Secondary | ICD-10-CM | POA: Diagnosis not present

## 2017-03-03 DIAGNOSIS — E1165 Type 2 diabetes mellitus with hyperglycemia: Secondary | ICD-10-CM | POA: Diagnosis not present

## 2017-03-03 DIAGNOSIS — Z6830 Body mass index (BMI) 30.0-30.9, adult: Secondary | ICD-10-CM | POA: Diagnosis not present

## 2017-03-11 ENCOUNTER — Encounter (HOSPITAL_COMMUNITY): Payer: PPO | Attending: Oncology

## 2017-03-11 ENCOUNTER — Encounter (HOSPITAL_COMMUNITY): Payer: Self-pay

## 2017-03-11 ENCOUNTER — Other Ambulatory Visit: Payer: Self-pay

## 2017-03-11 DIAGNOSIS — Z452 Encounter for adjustment and management of vascular access device: Secondary | ICD-10-CM | POA: Diagnosis not present

## 2017-03-11 DIAGNOSIS — Z1231 Encounter for screening mammogram for malignant neoplasm of breast: Secondary | ICD-10-CM | POA: Insufficient documentation

## 2017-03-11 DIAGNOSIS — C50212 Malignant neoplasm of upper-inner quadrant of left female breast: Secondary | ICD-10-CM | POA: Diagnosis not present

## 2017-03-11 DIAGNOSIS — C50912 Malignant neoplasm of unspecified site of left female breast: Secondary | ICD-10-CM | POA: Insufficient documentation

## 2017-03-11 DIAGNOSIS — C50412 Malignant neoplasm of upper-outer quadrant of left female breast: Secondary | ICD-10-CM

## 2017-03-11 DIAGNOSIS — Z95828 Presence of other vascular implants and grafts: Secondary | ICD-10-CM | POA: Insufficient documentation

## 2017-03-11 MED ORDER — HEPARIN SOD (PORK) LOCK FLUSH 100 UNIT/ML IV SOLN
500.0000 [IU] | Freq: Once | INTRAVENOUS | Status: AC
  Administered 2017-03-11: 500 [IU] via INTRAVENOUS

## 2017-03-11 MED ORDER — SODIUM CHLORIDE 0.9% FLUSH
10.0000 mL | INTRAVENOUS | Status: DC | PRN
  Administered 2017-03-11: 10 mL via INTRAVENOUS
  Filled 2017-03-11: qty 10

## 2017-03-11 NOTE — Progress Notes (Signed)
Julie Parker presented for Portacath access and flush.   Portacath located right chest wall accessed with  H 20 needle.  Good blood return present. Portacath flushed with 36ml NS and 500U/66ml Heparin and needle removed intact.  Procedure tolerated well and without incident.  Discharged via wheelchair.

## 2017-04-07 DIAGNOSIS — I639 Cerebral infarction, unspecified: Secondary | ICD-10-CM | POA: Diagnosis not present

## 2017-04-07 DIAGNOSIS — I10 Essential (primary) hypertension: Secondary | ICD-10-CM | POA: Diagnosis not present

## 2017-04-07 DIAGNOSIS — E7800 Pure hypercholesterolemia, unspecified: Secondary | ICD-10-CM | POA: Diagnosis not present

## 2017-04-07 DIAGNOSIS — E119 Type 2 diabetes mellitus without complications: Secondary | ICD-10-CM | POA: Diagnosis not present

## 2017-04-08 DIAGNOSIS — I10 Essential (primary) hypertension: Secondary | ICD-10-CM | POA: Diagnosis not present

## 2017-04-08 DIAGNOSIS — Z1211 Encounter for screening for malignant neoplasm of colon: Secondary | ICD-10-CM | POA: Diagnosis not present

## 2017-04-08 DIAGNOSIS — Z6830 Body mass index (BMI) 30.0-30.9, adult: Secondary | ICD-10-CM | POA: Diagnosis not present

## 2017-04-08 DIAGNOSIS — E559 Vitamin D deficiency, unspecified: Secondary | ICD-10-CM | POA: Diagnosis not present

## 2017-04-08 DIAGNOSIS — Z1331 Encounter for screening for depression: Secondary | ICD-10-CM | POA: Diagnosis not present

## 2017-04-08 DIAGNOSIS — Z79899 Other long term (current) drug therapy: Secondary | ICD-10-CM | POA: Diagnosis not present

## 2017-04-08 DIAGNOSIS — Z299 Encounter for prophylactic measures, unspecified: Secondary | ICD-10-CM | POA: Diagnosis not present

## 2017-04-08 DIAGNOSIS — E7800 Pure hypercholesterolemia, unspecified: Secondary | ICD-10-CM | POA: Diagnosis not present

## 2017-04-08 DIAGNOSIS — Z0000 Encounter for general adult medical examination without abnormal findings: Secondary | ICD-10-CM | POA: Diagnosis not present

## 2017-04-08 DIAGNOSIS — E1165 Type 2 diabetes mellitus with hyperglycemia: Secondary | ICD-10-CM | POA: Diagnosis not present

## 2017-04-08 DIAGNOSIS — Z7189 Other specified counseling: Secondary | ICD-10-CM | POA: Diagnosis not present

## 2017-04-08 DIAGNOSIS — R5383 Other fatigue: Secondary | ICD-10-CM | POA: Diagnosis not present

## 2017-04-08 DIAGNOSIS — Z1339 Encounter for screening examination for other mental health and behavioral disorders: Secondary | ICD-10-CM | POA: Diagnosis not present

## 2017-04-09 DIAGNOSIS — Z9012 Acquired absence of left breast and nipple: Secondary | ICD-10-CM | POA: Diagnosis not present

## 2017-04-11 DIAGNOSIS — R55 Syncope and collapse: Secondary | ICD-10-CM | POA: Diagnosis not present

## 2017-04-11 DIAGNOSIS — R404 Transient alteration of awareness: Secondary | ICD-10-CM | POA: Diagnosis not present

## 2017-05-12 ENCOUNTER — Ambulatory Visit (HOSPITAL_COMMUNITY): Payer: PPO | Admitting: Adult Health

## 2017-05-12 ENCOUNTER — Other Ambulatory Visit (HOSPITAL_COMMUNITY): Payer: Self-pay | Admitting: *Deleted

## 2017-05-12 ENCOUNTER — Other Ambulatory Visit (HOSPITAL_COMMUNITY): Payer: PPO

## 2017-05-12 ENCOUNTER — Encounter (HOSPITAL_COMMUNITY): Payer: PPO

## 2017-05-12 DIAGNOSIS — C50419 Malignant neoplasm of upper-outer quadrant of unspecified female breast: Secondary | ICD-10-CM

## 2017-05-13 ENCOUNTER — Ambulatory Visit (HOSPITAL_COMMUNITY): Payer: PPO | Admitting: Oncology

## 2017-05-13 ENCOUNTER — Other Ambulatory Visit (HOSPITAL_COMMUNITY): Payer: PPO

## 2017-05-13 ENCOUNTER — Encounter (HOSPITAL_COMMUNITY): Payer: PPO

## 2017-06-04 DIAGNOSIS — C50912 Malignant neoplasm of unspecified site of left female breast: Secondary | ICD-10-CM | POA: Diagnosis not present

## 2017-06-04 DIAGNOSIS — E1165 Type 2 diabetes mellitus with hyperglycemia: Secondary | ICD-10-CM | POA: Diagnosis not present

## 2017-06-04 DIAGNOSIS — Z299 Encounter for prophylactic measures, unspecified: Secondary | ICD-10-CM | POA: Diagnosis not present

## 2017-06-04 DIAGNOSIS — I10 Essential (primary) hypertension: Secondary | ICD-10-CM | POA: Diagnosis not present

## 2017-06-04 DIAGNOSIS — L739 Follicular disorder, unspecified: Secondary | ICD-10-CM | POA: Diagnosis not present

## 2017-06-04 DIAGNOSIS — Z6830 Body mass index (BMI) 30.0-30.9, adult: Secondary | ICD-10-CM | POA: Diagnosis not present

## 2017-07-14 ENCOUNTER — Ambulatory Visit (HOSPITAL_COMMUNITY): Payer: PPO | Admitting: Internal Medicine

## 2017-07-14 ENCOUNTER — Other Ambulatory Visit (HOSPITAL_COMMUNITY): Payer: PPO

## 2017-07-14 ENCOUNTER — Encounter (HOSPITAL_COMMUNITY): Payer: PPO

## 2017-07-22 DIAGNOSIS — Z170 Estrogen receptor positive status [ER+]: Secondary | ICD-10-CM | POA: Diagnosis not present

## 2017-07-22 DIAGNOSIS — C50412 Malignant neoplasm of upper-outer quadrant of left female breast: Secondary | ICD-10-CM | POA: Diagnosis not present

## 2017-08-04 ENCOUNTER — Encounter (HOSPITAL_COMMUNITY): Payer: PPO

## 2017-08-04 ENCOUNTER — Ambulatory Visit (HOSPITAL_COMMUNITY): Payer: PPO | Admitting: Internal Medicine

## 2017-08-04 ENCOUNTER — Other Ambulatory Visit (HOSPITAL_COMMUNITY): Payer: PPO

## 2017-08-14 ENCOUNTER — Encounter (HOSPITAL_COMMUNITY): Payer: Self-pay

## 2017-08-14 ENCOUNTER — Inpatient Hospital Stay (HOSPITAL_COMMUNITY): Payer: PPO

## 2017-08-14 ENCOUNTER — Other Ambulatory Visit: Payer: Self-pay

## 2017-08-14 ENCOUNTER — Inpatient Hospital Stay (HOSPITAL_COMMUNITY): Payer: PPO | Attending: Internal Medicine | Admitting: Internal Medicine

## 2017-08-14 ENCOUNTER — Encounter (HOSPITAL_COMMUNITY): Payer: Self-pay | Admitting: Internal Medicine

## 2017-08-14 VITALS — BP 163/88 | HR 72 | Resp 18

## 2017-08-14 DIAGNOSIS — F17210 Nicotine dependence, cigarettes, uncomplicated: Secondary | ICD-10-CM

## 2017-08-14 DIAGNOSIS — E119 Type 2 diabetes mellitus without complications: Secondary | ICD-10-CM | POA: Diagnosis not present

## 2017-08-14 DIAGNOSIS — Z79811 Long term (current) use of aromatase inhibitors: Secondary | ICD-10-CM | POA: Diagnosis not present

## 2017-08-14 DIAGNOSIS — Z9221 Personal history of antineoplastic chemotherapy: Secondary | ICD-10-CM | POA: Insufficient documentation

## 2017-08-14 DIAGNOSIS — Z79899 Other long term (current) drug therapy: Secondary | ICD-10-CM | POA: Diagnosis not present

## 2017-08-14 DIAGNOSIS — C50412 Malignant neoplasm of upper-outer quadrant of left female breast: Secondary | ICD-10-CM

## 2017-08-14 DIAGNOSIS — Z8673 Personal history of transient ischemic attack (TIA), and cerebral infarction without residual deficits: Secondary | ICD-10-CM | POA: Diagnosis not present

## 2017-08-14 DIAGNOSIS — Z170 Estrogen receptor positive status [ER+]: Secondary | ICD-10-CM | POA: Insufficient documentation

## 2017-08-14 DIAGNOSIS — I10 Essential (primary) hypertension: Secondary | ICD-10-CM | POA: Diagnosis not present

## 2017-08-14 DIAGNOSIS — Z9012 Acquired absence of left breast and nipple: Secondary | ICD-10-CM | POA: Insufficient documentation

## 2017-08-14 DIAGNOSIS — Z7982 Long term (current) use of aspirin: Secondary | ICD-10-CM | POA: Diagnosis not present

## 2017-08-14 DIAGNOSIS — C50912 Malignant neoplasm of unspecified site of left female breast: Secondary | ICD-10-CM

## 2017-08-14 MED ORDER — SODIUM CHLORIDE 0.9% FLUSH
10.0000 mL | INTRAVENOUS | Status: DC | PRN
  Administered 2017-08-14: 10 mL via INTRAVENOUS
  Filled 2017-08-14: qty 10

## 2017-08-14 MED ORDER — HEPARIN SOD (PORK) LOCK FLUSH 100 UNIT/ML IV SOLN
500.0000 [IU] | Freq: Once | INTRAVENOUS | Status: AC
  Administered 2017-08-14: 500 [IU] via INTRAVENOUS

## 2017-08-14 NOTE — Patient Instructions (Signed)
[facility] at [facility] Discharge Instructions  Portacath flushed per protocol today. Follow-up as scheduled. Call clinic for any questions or concerns   Thank you for choosing [facility] at [facility] to provide your oncology and hematology care.  To afford each patient quality time with our provider, please arrive at least 15 minutes before your scheduled appointment time.   If you have a lab appointment with the Cancer Center please come in thru the  Main Entrance and check in at the main information desk  You need to re-schedule your appointment should you arrive 10 or more minutes late.  We strive to give you quality time with our providers, and arriving late affects you and other patients whose appointments are after yours.  Also, if you no show three or more times for appointments you may be dismissed from the clinic at the providers discretion.     Again, thank you for choosing [facility].  Our hope is that these requests will decrease the amount of time that you wait before being seen by our physicians.       _____________________________________________________________  Should you have questions after your visit to [facility], please contact our office at [phone] between the hours of 8:30 a.m. and 4:30 p.m.  Voicemails left after 4:30 p.m. will not be returned until the following business day.  For prescription refill requests, have your pharmacy contact our office.       Resources For Cancer Patients and their Caregivers ? American Cancer Society: Can assist with transportation, wigs, general needs, runs Look Good Feel Better.        1-888-227-6333 ? Cancer Care: Provides financial assistance, online support groups, medication/co-pay assistance.  1-800-813-HOPE (4673) ? [facility] Assists [place] cancer patients and their families through emotional , educational and  financial support.  336-427-4357 ? [place] DSS Where to apply for food stamps, Medicaid and utility assistance. 336-342-1394 ? RCATS: Transportation to medical appointments. 336-347-2287 ? Social Security Administration: May apply for disability if have a Stage IV cancer. 336-342-7796 1-800-772-1213 ? [place] Aging, Disability and Transit Services: Assists with nutrition, care and transit needs. 336-349-2343  Cancer Center Support Programs:   > Cancer Support Group  2nd Tuesday of the month 1pm-2pm, Journey Room   > Creative Journey  3rd Tuesday of the month 1130am-1pm, Journey Room     

## 2017-08-14 NOTE — Progress Notes (Signed)
Felecia Stanfill Koman tolerated portacath flush well without complaints or incident. Port accessed with 20 gauge needle with blood return noted then flushed with 10 ml NS and 5 ml Heparin easily per protocol then de-accessed. Pt discharged via wheelchair in satisfactory condition

## 2017-08-31 ENCOUNTER — Other Ambulatory Visit (HOSPITAL_COMMUNITY): Payer: Self-pay | Admitting: Internal Medicine

## 2017-08-31 DIAGNOSIS — Z1231 Encounter for screening mammogram for malignant neoplasm of breast: Secondary | ICD-10-CM

## 2017-09-01 ENCOUNTER — Encounter (HOSPITAL_COMMUNITY): Payer: PPO

## 2017-09-04 ENCOUNTER — Ambulatory Visit (HOSPITAL_COMMUNITY)
Admission: RE | Admit: 2017-09-04 | Discharge: 2017-09-04 | Disposition: A | Discharge status: Home / Self Care | Payer: PPO | Source: Ambulatory Visit | Attending: Internal Medicine | Admitting: Internal Medicine

## 2017-09-04 DIAGNOSIS — Z1231 Encounter for screening mammogram for malignant neoplasm of breast: Secondary | ICD-10-CM

## 2017-09-07 ENCOUNTER — Other Ambulatory Visit (HOSPITAL_COMMUNITY): Payer: Self-pay | Admitting: Internal Medicine

## 2017-09-07 DIAGNOSIS — R921 Mammographic calcification found on diagnostic imaging of breast: Secondary | ICD-10-CM

## 2017-09-15 ENCOUNTER — Other Ambulatory Visit (HOSPITAL_COMMUNITY): Payer: Self-pay | Admitting: Internal Medicine

## 2017-09-15 ENCOUNTER — Ambulatory Visit (HOSPITAL_COMMUNITY)
Admission: RE | Admit: 2017-09-15 | Discharge: 2017-09-15 | Disposition: A | Discharge status: Home / Self Care | Payer: PPO | Source: Ambulatory Visit | Attending: Internal Medicine | Admitting: Internal Medicine

## 2017-09-15 DIAGNOSIS — R921 Mammographic calcification found on diagnostic imaging of breast: Secondary | ICD-10-CM

## 2017-09-15 NOTE — Progress Notes (Signed)
Diagnosis Cancer of left breast, stage 2 ([place]) - Plan: CBC with Differential/Platelet, Comprehensive metabolic panel, Lactate dehydrogenase, CANCELED: MM DIAG BREAST TOMO UNI RIGHT  Staging Cancer Staging Cancer of left breast, stage 2 ([place]) Staging form: Breast, AJCC 7th Edition - Clinical stage from 01/26/2016: Stage IIB (T2(2), N1, M0) - Signed by Patrici Ranks, MD on 01/26/2016    Stage IIB multifocal left breast cancer, ER+/PR+/HER2-  CURRENT THERAPY:  Arimidex which began in 11/2016.   Assessment and Plan: 1.  Stage IIB multifocal (L) breast invasive ductal carcinoma; ER+/PR+/HER2-.  Pt was previously followed by Dr. Talbert Cage and NP Renato Battles.   She remains on Arimidex which began in 11/2016.  She will be set up for right screening mammogram in 08/2017.  She will RTC in 11/2017 for follow-up to go over results and repeat labs.    2.  CVA.  She has right sided weakness.  BP is 163/88.  Follow-up with PCP  3.  HTN. BP is 163/88.  Follow-up with PCP.  4.  Health maintenance.  Will determine when last BMD done.  If not in past 2 yrs will schedule.    5.  Smoking.  Cessation is recommended.     Interval History:  62 yr old female with Stage IIB multifocal (L) breast invasive ductal carcinoma; ER+/PR+/HER2-.  Pt was previously followed by Dr. Talbert Cage and NP Renato Battles.  She was diagnosed in 07/2015 in [place] at [facility] & followed by Dr. Tressie Stalker. Surgery was attempted in 10/2015, but her BP was poorly controlled and her surgery was subsequently cancelled for BP optimization.  On 12/13/15, she underwent (L) mastectomy by Dr. Brantley Stage. Mammaprint with high-risk score and therefore adjuvant chemotherapy was recommended. Her medical oncology care was transferred to [facility] in 01/2016 with the closure of [facility] in [place].  Given her significant co-morbid conditions, CMF chemotherapy x 6 cycles was completed. She went on to complete post-mastectomy radiation in [place]  from 07/29/16-09/17/16. Started anti-estrogen therapy in 11/2016. She had testing done with results showing High Risk Mammaprint  Current Status:  Pt is seen today for follow-up.  She has not undergone mammogram.    Oncology History   Multifocal L breast cancer     Cancer of left breast, stage 2 ([place])   07/04/2015 Mammogram    Digital mammography reveals 3 abnormal areas in the left breast, 2:00 position, 1.6 x 1.4 cm palpable mass, 10:00 position 1.7 x 1.4 cm palpable mass, upper outer quadrant 1.5-1.1 pleomorphic calcifications        07/04/2015 Initial Biopsy    Biopsies reveals invasive ductal carcinoma at the 2:00 position in the 10:00 position, ER positive greater than 90%, PR +1-5%, HER-2 negative Ki-67 marker 26%, the second tumor greater than 90% ER positive, 80% PR positive, HER-2 negative, Ki-67 marker 24%          08/17/2015 Imaging    MRI breast: 1.4 cm area of nodular and linear enhancement in the upper inner quadrant of the LEFT breast suspicious for ductal carcinoma in situ and possible invasive ductal carcinoma. There are no findings suspicious for malignancy in the right breast. 2.1 cm biopsy-proven invasive ductal carcinoma in the 10 o'clock position of the left breast. 1.7 cm biopsy-proven invasive ductal carcinoma in the 2 o'clock position of the left breast       12/13/2015 Initial Diagnosis    Breast cancer, left ([place])      12/13/2015 Surgery    Multifocal  L breast cancer, L simple mastectomy with L sentinel LN mapping      12/18/2015 Pathology Results    Invasive ductal adenocarcinoma, grade 3, spanning 2.6 cm and 1.9 cm. Ductal carcinoma in situ, grade 3. Lymphovascular invasion identified. Margins of resection were negative for carcinoma. 1/1 metastatic ductal adenocarcinoma in left axillary sentinel lymph node biopsy.      12/18/2015 Pathology Results    ER 100% positive, PR 100% positive, KI-67 15%, HER-2 negative.      02/22/2016 - 06/20/2016 Chemotherapy     The patient had palonosetron (ALOXI) injection 0.25 mg, 0.25 mg, Intravenous,  Once, 3 of 6 cycles  methotrexate (PF) chemo injection 82 mg, 40 mg/m2 = 82 mg, Intravenous,  Once, 3 of 6 cycles  pegfilgrastim (NEULASTA) injection 6 mg, 6 mg, Subcutaneous, Once, 3 of 6 cycles  pegfilgrastim (NEULASTA ONPRO KIT) injection 6 mg, 6 mg, Subcutaneous, Once, 2 of 2 cycles  cyclophosphamide (CYTOXAN) 1,240 mg in sodium chloride 0.9 % 250 mL chemo infusion, 600 mg/m2 = 1,240 mg, Intravenous,  Once, 3 of 6 cycles  fluorouracil (ADRUCIL) chemo injection 1,250 mg, 600 mg/m2 = 1,250 mg, Intravenous,  Once, 3 of 6 cycles  for chemotherapy treatment.        07/29/2016 - 09/17/2016 Radiation Therapy    Adjuvant radiation therapy (done in [place] at [facility])      11/2016 -  Anti-estrogen oral therapy    Anastrozole daily. Planned duration of treatment 5-10 years.         Problem List Patient Active Problem List   Diagnosis Date Noted  . Cancer of left breast, stage 2 ([place]) [C50.912] 12/13/2015    Past Medical History Past Medical History:  Diagnosis Date  . Arthritis   . Cancer [facility])    left breast cancer  . Cancer of left breast, stage 2 ([place]) 12/13/2015  . Diabetes mellitus without complication ([place])   . Hypertension   . Pneumonia   . Seizures ([place])   . Stroke [facility])    2 strokes, right side weakness    Past Surgical History Past Surgical History:  Procedure Laterality Date  . ABDOMINAL HYSTERECTOMY    . FOOT SURGERY Right   . PORTACATH PLACEMENT Right 02/14/2016   Procedure: INSERTION PORT-A-CATH;  Surgeon: Vickie Epley, MD;  Location: [facility];  Service: Vascular;  Laterality: Right;  . SIMPLE MASTECTOMY WITH AXILLARY SENTINEL NODE BIOPSY Left 12/13/2015   Procedure: LEFT SIMPLE MASTECTOMY WITH AXILLARY SENTINEL NODE BIOPSY;  Surgeon: Erroll Luna, MD;  Location: [place];  Service: General;  Laterality: Left;  . TUBAL LIGATION      Family History Family History   Problem Relation Age of Onset  . Hypertension Mother   . Diabetes Mother   . Other Father        sleeping disease     Social History  reports that she has been smoking cigarettes.  She has a 5.00 pack-year smoking history. She has never used smokeless tobacco. She reports that she does not drink alcohol or use drugs.  Medications  Current Outpatient Medications:  .  anastrozole (ARIMIDEX) 1 MG tablet, Take 1 tablet (1 mg total) by mouth daily., Disp: 90 tablet, Rfl: 2 .  aspirin 81 MG EC tablet, Take 1 tablet by mouth daily., Disp: , Rfl:  .  atenolol (TENORMIN) 50 MG tablet, Take 1 tablet by mouth daily., Disp: , Rfl:  .  baclofen (LIORESAL) 10 MG tablet, Take 1 tablet by mouth 3 (three)  times daily as needed for muscle spasms. , Disp: , Rfl:  .  benazepril (LOTENSIN) 40 MG tablet, , Disp: , Rfl:  .  calcium-vitamin D (OSCAL WITH D) 500-200 MG-UNIT tablet, Take 2 tablets by mouth daily with breakfast., Disp: 60 tablet, Rfl: 5 .  chlorthalidone (HYGROTON) 25 MG tablet, Take 1 tablet by mouth daily., Disp: , Rfl:  .  clopidogrel (PLAVIX) 75 MG tablet, Take 1 tablet by mouth daily., Disp: , Rfl:  .  CYCLOPHOSPHAMIDE IV, Inject into the vein. Every 21 days, Disp: , Rfl:  .  dexamethasone (DECADRON) 4 MG tablet, Take 2 tablets (8 mg total) by mouth daily. Start the day after chemotherapy for 2 days. Take with food., Disp: 30 tablet, Rfl: 1 .  fluorouracil (ADRUCIL) 2.5 GM/50ML SOLN, Inject into the vein once. Injection every 21 days, Disp: , Rfl:  .  lidocaine-prilocaine (EMLA) cream, Apply to affected area once, Disp: 30 g, Rfl: 3 .  LORazepam (ATIVAN) 0.5 MG tablet, Take 1 tablet by mouth daily as needed for anxiety. , Disp: , Rfl:  .  methotrexate (PF) in sodium chloride 0.9 % 500 mL, Inject into the vein once. Every 21 days, Disp: , Rfl:  .  ondansetron (ZOFRAN) 8 MG tablet, Take 1 tablet (8 mg total) by mouth 2 (two) times daily as needed for refractory nausea / vomiting. Start on day 3  after chemotherapy., Disp: 30 tablet, Rfl: 1 .  ondansetron (ZOFRAN-ODT) 4 MG disintegrating tablet, Take 1 tablet (4 mg total) by mouth every 6 (six) hours as needed for nausea., Disp: 20 tablet, Rfl: 0 .  oxyCODONE (OXY IR/ROXICODONE) 5 MG immediate release tablet, Take 1-2 tablets (5-10 mg total) by mouth every 4 (four) hours as needed for moderate pain., Disp: 30 tablet, Rfl: 0 .  potassium chloride (K-DUR) 10 MEQ tablet, Take 1 tablet (10 mEq total) by mouth 3 (three) times daily., Disp: 60 tablet, Rfl: 3 .  prochlorperazine (COMPAZINE) 10 MG tablet, Take 1 tablet (10 mg total) by mouth every 6 (six) hours as needed (Nausea or vomiting)., Disp: 30 tablet, Rfl: 1  Allergies No known allergies  Review of Systems Review of Systems - Oncology ROS as per HPI otherwise 12 point ROS is negative.   Physical Exam  Vitals Wt Readings from Last 3 Encounters:  01/09/17 184 lb (83.5 kg)  06/19/16 205 lb 4.8 oz (93.1 kg)  05/28/16 204 lb (92.5 kg)   Temp Readings from Last 3 Encounters:  03/11/17 98.6 F (37 C) (Oral)  06/20/16 97.8 F (36.6 C) (Oral)  06/19/16 98.4 F (36.9 C) (Oral)   BP Readings from Last 3 Encounters:  08/14/17 (!) 163/88  03/11/17 (!) 163/93  01/09/17 95/65   Pulse Readings from Last 3 Encounters:  08/14/17 72  03/11/17 (!) 58  01/09/17 82    Constitutional: Well-developed, well-nourished, and in no distress.   HENT: Head: Normocephalic and atraumatic.  Mouth/Throat: No oropharyngeal exudate. Mucosa moist. Eyes: Pupils are equal, round, and reactive to light. Conjunctivae are normal. No scleral icterus.  Neck: Normal range of motion. Neck supple. No JVD present.  Cardiovascular: Normal rate, regular rhythm and normal heart sounds.  Exam reveals no gallop and no friction rub.   No murmur heard. Pulmonary/Chest: Effort normal and breath sounds normal. No respiratory distress. No wheezes.No rales.  Abdominal: Soft. Bowel sounds are normal. No distension.  There is no tenderness. There is no guarding.  Musculoskeletal: No edema or tenderness.  Lymphadenopathy:No cervical, axillary or supraclavicular  adenopathy.  Neurological: Alert and oriented to person, place, and time. No cranial nerve deficit.  Skin: Skin is warm and dry. No rash noted. No erythema. No pallor.  Psychiatric: Affect and judgment normal.  Bilateral breast exam:  Chaperone present.  Left mastectomy.  No palpable abnormalities noted in right breast.    Labs No visits with results within 3 Day(s) from this visit.  Latest known visit with results is:  Office Visit on 01/09/2017  Component Date Value Ref Range Status  . WBC 01/09/2017 5.6  4.0 - 10.5 K/uL Final  . RBC 01/09/2017 4.26  3.87 - 5.11 MIL/uL Final  . Hemoglobin 01/09/2017 12.9  12.0 - 15.0 g/dL Final  . HCT 01/09/2017 36.1  36.0 - 46.0 % Final  . MCV 01/09/2017 84.7  78.0 - 100.0 fL Final  . MCH 01/09/2017 30.3  26.0 - 34.0 pg Final  . MCHC 01/09/2017 35.7  30.0 - 36.0 g/dL Final  . RDW 01/09/2017 14.1  11.5 - 15.5 % Final  . Platelets 01/09/2017 339  150 - 400 K/uL Final  . Neutrophils Relative % 01/09/2017 55  % Final  . Neutro Abs 01/09/2017 3.1  1.7 - 7.7 K/uL Final  . Lymphocytes Relative 01/09/2017 32  % Final  . Lymphs Abs 01/09/2017 1.8  0.7 - 4.0 K/uL Final  . Monocytes Relative 01/09/2017 9  % Final  . Monocytes Absolute 01/09/2017 0.5  0.1 - 1.0 K/uL Final  . Eosinophils Relative 01/09/2017 3  % Final  . Eosinophils Absolute 01/09/2017 0.2  0.0 - 0.7 K/uL Final  . Basophils Relative 01/09/2017 1  % Final  . Basophils Absolute 01/09/2017 0.0  0.0 - 0.1 K/uL Final  . Sodium 01/09/2017 140  135 - 145 mmol/L Final  . Potassium 01/09/2017 3.4* 3.5 - 5.1 mmol/L Final  . Chloride 01/09/2017 104  101 - 111 mmol/L Final  . CO2 01/09/2017 25  22 - 32 mmol/L Final  . Glucose, Bld 01/09/2017 98  65 - 99 mg/dL Final  . BUN 01/09/2017 27* 6 - 20 mg/dL Final  . Creatinine, Ser 01/09/2017 1.11* 0.44 - 1.00  mg/dL Final  . Calcium 01/09/2017 10.3  8.9 - 10.3 mg/dL Final  . Total Protein 01/09/2017 7.9  6.5 - 8.1 g/dL Final  . Albumin 01/09/2017 4.2  3.5 - 5.0 g/dL Final  . AST 01/09/2017 19  15 - 41 U/L Final  . ALT 01/09/2017 18  14 - 54 U/L Final  . Alkaline Phosphatase 01/09/2017 72  38 - 126 U/L Final  . Total Bilirubin 01/09/2017 0.5  0.3 - 1.2 mg/dL Final  . GFR calc non Af Amer 01/09/2017 53* >60 mL/min Final  . GFR calc Af Amer 01/09/2017 >60  >60 mL/min Final   Comment: (NOTE) The eGFR has been calculated using the CKD EPI equation. This calculation has not been validated in all clinical situations. eGFR's persistently <60 mL/min signify possible Chronic Kidney Disease.   . Anion gap 01/09/2017 11  5 - 15 Final     Pathology Orders Placed This Encounter  Procedures  . CBC with Differential/Platelet    Standing Status:   Future    Standing Expiration Date:   08/15/2018  . Comprehensive metabolic panel    Standing Status:   Future    Standing Expiration Date:   08/15/2018  . Lactate dehydrogenase    Standing Status:   Future    Standing Expiration Date:   08/15/2018       Mathis Dad  Champion Corales MD

## 2017-09-18 DIAGNOSIS — I10 Essential (primary) hypertension: Secondary | ICD-10-CM | POA: Diagnosis not present

## 2017-09-18 DIAGNOSIS — Z299 Encounter for prophylactic measures, unspecified: Secondary | ICD-10-CM | POA: Diagnosis not present

## 2017-09-18 DIAGNOSIS — I639 Cerebral infarction, unspecified: Secondary | ICD-10-CM | POA: Diagnosis not present

## 2017-09-18 DIAGNOSIS — Z6830 Body mass index (BMI) 30.0-30.9, adult: Secondary | ICD-10-CM | POA: Diagnosis not present

## 2017-09-18 DIAGNOSIS — E1165 Type 2 diabetes mellitus with hyperglycemia: Secondary | ICD-10-CM | POA: Diagnosis not present

## 2017-09-18 DIAGNOSIS — C50912 Malignant neoplasm of unspecified site of left female breast: Secondary | ICD-10-CM | POA: Diagnosis not present

## 2017-09-29 ENCOUNTER — Ambulatory Visit (HOSPITAL_COMMUNITY)
Admission: RE | Admit: 2017-09-29 | Discharge: 2017-09-29 | Disposition: A | Discharge status: Home / Self Care | Payer: PPO | Source: Ambulatory Visit | Attending: Internal Medicine | Admitting: Internal Medicine

## 2017-09-29 DIAGNOSIS — R921 Mammographic calcification found on diagnostic imaging of breast: Secondary | ICD-10-CM | POA: Insufficient documentation

## 2017-10-21 ENCOUNTER — Ambulatory Visit (HOSPITAL_COMMUNITY): Payer: PPO | Admitting: Internal Medicine

## 2017-11-13 ENCOUNTER — Encounter (HOSPITAL_COMMUNITY): Payer: Self-pay

## 2017-11-13 ENCOUNTER — Inpatient Hospital Stay (HOSPITAL_COMMUNITY): Payer: PPO | Attending: Internal Medicine

## 2017-11-13 DIAGNOSIS — F17210 Nicotine dependence, cigarettes, uncomplicated: Secondary | ICD-10-CM | POA: Diagnosis not present

## 2017-11-13 DIAGNOSIS — Z79899 Other long term (current) drug therapy: Secondary | ICD-10-CM | POA: Insufficient documentation

## 2017-11-13 DIAGNOSIS — R5383 Other fatigue: Secondary | ICD-10-CM | POA: Diagnosis not present

## 2017-11-13 DIAGNOSIS — G40909 Epilepsy, unspecified, not intractable, without status epilepticus: Secondary | ICD-10-CM | POA: Diagnosis not present

## 2017-11-13 DIAGNOSIS — Z79811 Long term (current) use of aromatase inhibitors: Secondary | ICD-10-CM | POA: Insufficient documentation

## 2017-11-13 DIAGNOSIS — Z170 Estrogen receptor positive status [ER+]: Secondary | ICD-10-CM | POA: Diagnosis not present

## 2017-11-13 DIAGNOSIS — E119 Type 2 diabetes mellitus without complications: Secondary | ICD-10-CM | POA: Diagnosis not present

## 2017-11-13 DIAGNOSIS — Z923 Personal history of irradiation: Secondary | ICD-10-CM | POA: Diagnosis not present

## 2017-11-13 DIAGNOSIS — C50212 Malignant neoplasm of upper-inner quadrant of left female breast: Secondary | ICD-10-CM | POA: Insufficient documentation

## 2017-11-13 DIAGNOSIS — Z7982 Long term (current) use of aspirin: Secondary | ICD-10-CM | POA: Insufficient documentation

## 2017-11-13 DIAGNOSIS — I10 Essential (primary) hypertension: Secondary | ICD-10-CM | POA: Insufficient documentation

## 2017-11-13 DIAGNOSIS — Z9221 Personal history of antineoplastic chemotherapy: Secondary | ICD-10-CM | POA: Insufficient documentation

## 2017-11-13 DIAGNOSIS — Z8673 Personal history of transient ischemic attack (TIA), and cerebral infarction without residual deficits: Secondary | ICD-10-CM | POA: Diagnosis not present

## 2017-11-13 MED ORDER — HEPARIN SOD (PORK) LOCK FLUSH 100 UNIT/ML IV SOLN
500.0000 [IU] | Freq: Once | INTRAVENOUS | Status: AC
  Administered 2017-11-13: 500 [IU] via INTRAVENOUS

## 2017-11-13 MED ORDER — SODIUM CHLORIDE 0.9% FLUSH
10.0000 mL | INTRAVENOUS | Status: DC | PRN
  Administered 2017-11-13: 10 mL via INTRAVENOUS
  Filled 2017-11-13: qty 10

## 2017-11-13 NOTE — Progress Notes (Signed)
Julie Parker tolerated portacath flush well without complaints or incident. Port accessed with 20 gauge needle with blood return noted then flushed with 10 ml NS and 5 ml Heparin easily per protocol then de-accessed. VSS Pt discharged via wheelchair in satisfactory condition

## 2017-11-13 NOTE — Patient Instructions (Signed)
[facility] at [facility] Discharge Instructions  Portacath flushed per protocol today. Follow-up as scheduled. Call clinic for any questions or concerns   Thank you for choosing [facility] at [facility] to provide your oncology and hematology care.  To afford each patient quality time with our provider, please arrive at least 15 minutes before your scheduled appointment time.   If you have a lab appointment with the Cancer Center please come in thru the  Main Entrance and check in at the main information desk  You need to re-schedule your appointment should you arrive 10 or more minutes late.  We strive to give you quality time with our providers, and arriving late affects you and other patients whose appointments are after yours.  Also, if you no show three or more times for appointments you may be dismissed from the clinic at the providers discretion.     Again, thank you for choosing [facility].  Our hope is that these requests will decrease the amount of time that you wait before being seen by our physicians.       _____________________________________________________________  Should you have questions after your visit to [facility], please contact our office at [phone] between the hours of 8:30 a.m. and 4:30 p.m.  Voicemails left after 4:30 p.m. will not be returned until the following business day.  For prescription refill requests, have your pharmacy contact our office.       Resources For Cancer Patients and their Caregivers ? American Cancer Society: Can assist with transportation, wigs, general needs, runs Look Good Feel Better.        1-888-227-6333 ? Cancer Care: Provides financial assistance, online support groups, medication/co-pay assistance.  1-800-813-HOPE (4673) ? [facility] Assists [place] cancer patients and their families through emotional , educational and  financial support.  336-427-4357 ? [place] DSS Where to apply for food stamps, Medicaid and utility assistance. 336-342-1394 ? [facility]: Transportation to medical appointments. 336-347-2287 ? Social Security Administration: May apply for disability if have a Stage IV cancer. 336-342-7796 1-800-772-1213 ? [place] Aging, Disability and Transit Services: Assists with nutrition, care and transit needs. 336-349-2343  Cancer Center Support Programs:   > Cancer Support Group  2nd Tuesday of the month 1pm-2pm, Journey Room   > Creative Journey  3rd Tuesday of the month 1130am-1pm, Journey Room     

## 2017-11-20 ENCOUNTER — Encounter (HOSPITAL_COMMUNITY): Payer: Self-pay | Admitting: Internal Medicine

## 2017-11-20 ENCOUNTER — Inpatient Hospital Stay (HOSPITAL_BASED_OUTPATIENT_CLINIC_OR_DEPARTMENT_OTHER): Payer: PPO | Admitting: Internal Medicine

## 2017-11-20 VITALS — BP 176/95 | HR 68 | Temp 97.6°F | Resp 16 | Wt 171.6 lb

## 2017-11-20 DIAGNOSIS — G40909 Epilepsy, unspecified, not intractable, without status epilepticus: Secondary | ICD-10-CM

## 2017-11-20 DIAGNOSIS — E119 Type 2 diabetes mellitus without complications: Secondary | ICD-10-CM

## 2017-11-20 DIAGNOSIS — I10 Essential (primary) hypertension: Secondary | ICD-10-CM

## 2017-11-20 DIAGNOSIS — Z9221 Personal history of antineoplastic chemotherapy: Secondary | ICD-10-CM | POA: Diagnosis not present

## 2017-11-20 DIAGNOSIS — Z7982 Long term (current) use of aspirin: Secondary | ICD-10-CM

## 2017-11-20 DIAGNOSIS — C50212 Malignant neoplasm of upper-inner quadrant of left female breast: Secondary | ICD-10-CM | POA: Diagnosis not present

## 2017-11-20 DIAGNOSIS — Z170 Estrogen receptor positive status [ER+]: Secondary | ICD-10-CM

## 2017-11-20 DIAGNOSIS — R5383 Other fatigue: Secondary | ICD-10-CM

## 2017-11-20 DIAGNOSIS — C50912 Malignant neoplasm of unspecified site of left female breast: Secondary | ICD-10-CM

## 2017-11-20 DIAGNOSIS — Z923 Personal history of irradiation: Secondary | ICD-10-CM | POA: Diagnosis not present

## 2017-11-20 DIAGNOSIS — F17210 Nicotine dependence, cigarettes, uncomplicated: Secondary | ICD-10-CM | POA: Diagnosis not present

## 2017-11-20 DIAGNOSIS — Z8673 Personal history of transient ischemic attack (TIA), and cerebral infarction without residual deficits: Secondary | ICD-10-CM | POA: Diagnosis not present

## 2017-11-20 DIAGNOSIS — Z79899 Other long term (current) drug therapy: Secondary | ICD-10-CM

## 2017-11-20 DIAGNOSIS — Z79811 Long term (current) use of aromatase inhibitors: Secondary | ICD-10-CM

## 2017-11-20 DIAGNOSIS — Z780 Asymptomatic menopausal state: Secondary | ICD-10-CM

## 2017-11-20 NOTE — Progress Notes (Signed)
Diagnosis Cancer of left breast, stage 2 ([place]) - Plan: CBC with Differential/Platelet, Comprehensive metabolic panel, Lactate dehydrogenase  Staging Cancer Staging Cancer of left breast, stage 2 ([employer]) Staging form: Breast, AJCC 7th Edition - Clinical stage from 01/26/2016: Stage IIB (T2(2), N1, M0) - Signed by Patrici Ranks, MD on 01/26/2016   Assessment and Plan:  CURRENT THERAPY:  Arimidex which began in 11/2016.   Assessment and Plan: 1.  62 yr old female with Stage IIB multifocal (L) breast invasive ductal carcinoma; ER+/PR+/HER2-.  Pt was previously followed by Dr. Talbert Cage and NP Renato Battles.   She is on Arimidex which began in 11/2016.    Pt is here to go over screening mammogram which was done 09/04/2017 and showed calcifications in right breast.  She was recommended for diagnostic mammogram that was done 09/15/2017 and was reviewed and showed benign calcifications in the right lower breast.  She is recommended for screening mammogram in 09/2018.  Pt will be seen in 03/2018 for follow-up and labs.  No palpable abnormalities noted on exam.    2.  CVA.  BP is 176/95. Pt has right sided weakness.  Follow-up with PCP.    3.  HTN. BP is elevated at 176.95.  Follow-up with PCP.    4.  Fatigue.  BP is elevated at 176/95.  Will check labs on RTC.  Recent HB was > 12.  She should notify the office if symptoms worsen prior to next visit.    5.  Health maintenance.  Will schedule Dexa as pt remains on Arimidex due to potential risk for osteoporosis.    6.  Smoking.  Cessation is recommended.     Interval History:  Historical data obtained from note dated 08/14/2017.  Pt is a 62 yr old female with Stage IIB multifocal (L) breast invasive ductal carcinoma; ER+/PR+/HER2-.  Pt was previously followed by Dr. Talbert Cage and NP Renato Battles.  She was diagnosed in 07/2015 in [place] at [facility] & followed by Dr. Tressie Stalker. Surgery was attempted in 10/2015, but her BP was poorly controlled and her surgery  was subsequently cancelled for BP optimization.  On 12/13/15, she underwent (L) mastectomy by Dr. Brantley Stage. Mammaprint with high-risk score and therefore adjuvant chemotherapy was recommended. Her medical oncology care was transferred to [facility] in 01/2016 with the closure of [facility] in [place].  Given her significant co-morbid conditions, CMF chemotherapy x 6 cycles was completed. She went on to complete post-mastectomy radiation in [place] from 07/29/16-09/17/16. Started anti-estrogen therapy in 11/2016. She had testing done with results showing High Risk Mammaprint  Current Status:  Pt is seen today for follow-up.  She is here to go over mammogram.  She reports some fatigue.    Oncology History   Multifocal L breast cancer     Cancer of left breast, stage 2 ([place])   07/04/2015 Mammogram    Digital mammography reveals 3 abnormal areas in the left breast, 2:00 position, 1.6 x 1.4 cm palpable mass, 10:00 position 1.7 x 1.4 cm palpable mass, upper outer quadrant 1.5-1.1 pleomorphic calcifications        07/04/2015 Initial Biopsy    Biopsies reveals invasive ductal carcinoma at the 2:00 position in the 10:00 position, ER positive greater than 90%, PR +1-5%, HER-2 negative Ki-67 marker 26%, the second tumor greater than 90% ER positive, 80% PR positive, HER-2 negative, Ki-67 marker 24%          08/17/2015 Imaging    MRI breast:  1.4 cm area of nodular and linear enhancement in the upper inner quadrant of the LEFT breast suspicious for ductal carcinoma in situ and possible invasive ductal carcinoma. There are no findings suspicious for malignancy in the right breast. 2.1 cm biopsy-proven invasive ductal carcinoma in the 10 o'clock position of the left breast. 1.7 cm biopsy-proven invasive ductal carcinoma in the 2 o'clock position of the left breast       12/13/2015 Initial Diagnosis    Breast cancer, left ([place])      12/13/2015 Surgery    Multifocal L breast cancer, L simple  mastectomy with L sentinel LN mapping      12/18/2015 Pathology Results    Invasive ductal adenocarcinoma, grade 3, spanning 2.6 cm and 1.9 cm. Ductal carcinoma in situ, grade 3. Lymphovascular invasion identified. Margins of resection were negative for carcinoma. 1/1 metastatic ductal adenocarcinoma in left axillary sentinel lymph node biopsy.      12/18/2015 Pathology Results    ER 100% positive, PR 100% positive, KI-67 15%, HER-2 negative.      02/22/2016 - 06/20/2016 Chemotherapy    The patient had palonosetron (ALOXI) injection 0.25 mg, 0.25 mg, Intravenous,  Once, 3 of 6 cycles  methotrexate (PF) chemo injection 82 mg, 40 mg/m2 = 82 mg, Intravenous,  Once, 3 of 6 cycles  pegfilgrastim (NEULASTA) injection 6 mg, 6 mg, Subcutaneous, Once, 3 of 6 cycles  pegfilgrastim (NEULASTA ONPRO KIT) injection 6 mg, 6 mg, Subcutaneous, Once, 2 of 2 cycles  cyclophosphamide (CYTOXAN) 1,240 mg in sodium chloride 0.9 % 250 mL chemo infusion, 600 mg/m2 = 1,240 mg, Intravenous,  Once, 3 of 6 cycles  fluorouracil (ADRUCIL) chemo injection 1,250 mg, 600 mg/m2 = 1,250 mg, Intravenous,  Once, 3 of 6 cycles  for chemotherapy treatment.        07/29/2016 - 09/17/2016 Radiation Therapy    Adjuvant radiation therapy (done in [place] at [facility])      11/2016 -  Anti-estrogen oral therapy    Anastrozole daily. Planned duration of treatment 5-10 years.         Problem List Patient Active Problem List   Diagnosis Date Noted  . Cancer of left breast, stage 2 ([place]) [C50.912] 12/13/2015    Past Medical History Past Medical History:  Diagnosis Date  . Arthritis   . Cancer [facility])    left breast cancer  . Cancer of left breast, stage 2 ([place]) 12/13/2015  . Diabetes mellitus without complication ([place])   . Hypertension   . Pneumonia   . Seizures ([place])   . Stroke [facility])    2 strokes, right side weakness    Past Surgical History Past Surgical History:  Procedure Laterality Date  . ABDOMINAL  HYSTERECTOMY    . FOOT SURGERY Right   . PORTACATH PLACEMENT Right 02/14/2016   Procedure: INSERTION PORT-A-CATH;  Surgeon: Vickie Epley, MD;  Location: [facility];  Service: Vascular;  Laterality: Right;  . SIMPLE MASTECTOMY WITH AXILLARY SENTINEL NODE BIOPSY Left 12/13/2015   Procedure: LEFT SIMPLE MASTECTOMY WITH AXILLARY SENTINEL NODE BIOPSY;  Surgeon: Erroll Luna, MD;  Location: [facility];  Service: General;  Laterality: Left;  . TUBAL LIGATION      Family History Family History  Problem Relation Age of Onset  . Hypertension Mother   . Diabetes Mother   . Other Father        sleeping disease     Social History  reports that she has been smoking cigarettes.  She has a 5.00  pack-year smoking history. She has never used smokeless tobacco. She reports that she does not drink alcohol or use drugs.  Medications  Current Outpatient Medications:  .  amLODipine (NORVASC) 10 MG tablet, Take 10 mg by mouth daily., Disp: , Rfl: 0 .  anastrozole (ARIMIDEX) 1 MG tablet, Take 1 tablet (1 mg total) by mouth daily., Disp: 90 tablet, Rfl: 2 .  aspirin 81 MG EC tablet, Take 1 tablet by mouth daily., Disp: , Rfl:  .  atenolol (TENORMIN) 50 MG tablet, Take 1 tablet by mouth daily., Disp: , Rfl:  .  baclofen (LIORESAL) 10 MG tablet, Take 1 tablet by mouth 3 (three) times daily as needed for muscle spasms. , Disp: , Rfl:  .  benazepril (LOTENSIN) 40 MG tablet, , Disp: , Rfl:  .  calcium-vitamin D (OSCAL WITH D) 500-200 MG-UNIT tablet, Take 2 tablets by mouth daily with breakfast., Disp: 60 tablet, Rfl: 5 .  chlorthalidone (HYGROTON) 25 MG tablet, Take 1 tablet by mouth daily., Disp: , Rfl:  .  clopidogrel (PLAVIX) 75 MG tablet, Take 1 tablet by mouth daily., Disp: , Rfl:  .  CYCLOPHOSPHAMIDE IV, Inject into the vein. Every 21 days, Disp: , Rfl:  .  dexamethasone (DECADRON) 4 MG tablet, Take 2 tablets (8 mg total) by mouth daily. Start the day after chemotherapy for 2 days. Take with food., Disp: 30  tablet, Rfl: 1 .  fluorouracil (ADRUCIL) 2.5 GM/50ML SOLN, Inject into the vein once. Injection every 21 days, Disp: , Rfl:  .  JANUVIA 100 MG tablet, Take 100 mg by mouth daily., Disp: , Rfl: 0 .  lidocaine-prilocaine (EMLA) cream, Apply to affected area once, Disp: 30 g, Rfl: 3 .  LORazepam (ATIVAN) 0.5 MG tablet, Take 1 tablet by mouth daily as needed for anxiety. , Disp: , Rfl:  .  metFORMIN (GLUCOPHAGE) 1000 MG tablet, Take 1,000 mg by mouth 2 (two) times daily., Disp: , Rfl: 0 .  methotrexate (PF) in sodium chloride 0.9 % 500 mL, Inject into the vein once. Every 21 days, Disp: , Rfl:  .  ondansetron (ZOFRAN) 8 MG tablet, Take 1 tablet (8 mg total) by mouth 2 (two) times daily as needed for refractory nausea / vomiting. Start on day 3 after chemotherapy., Disp: 30 tablet, Rfl: 1 .  ondansetron (ZOFRAN-ODT) 4 MG disintegrating tablet, Take 1 tablet (4 mg total) by mouth every 6 (six) hours as needed for nausea., Disp: 20 tablet, Rfl: 0 .  oxyCODONE (OXY IR/ROXICODONE) 5 MG immediate release tablet, Take 1-2 tablets (5-10 mg total) by mouth every 4 (four) hours as needed for moderate pain., Disp: 30 tablet, Rfl: 0 .  potassium chloride (K-DUR) 10 MEQ tablet, Take 1 tablet (10 mEq total) by mouth 3 (three) times daily., Disp: 60 tablet, Rfl: 3 .  potassium chloride (K-DUR,KLOR-CON) 10 MEQ tablet, Take 10 mEq by mouth 2 (two) times daily., Disp: , Rfl: 0 .  prochlorperazine (COMPAZINE) 10 MG tablet, Take 1 tablet (10 mg total) by mouth every 6 (six) hours as needed (Nausea or vomiting)., Disp: 30 tablet, Rfl: 1  Allergies No known allergies  Review of Systems Review of Systems - Oncology ROS negative other than fatigue   Physical Exam  Vitals Wt Readings from Last 3 Encounters:  11/20/17 171 lb 9.6 oz (77.8 kg)  01/09/17 184 lb (83.5 kg)  06/19/16 205 lb 4.8 oz (93.1 kg)   Temp Readings from Last 3 Encounters:  11/20/17 97.6 F (36.4 C) (Oral)  11/13/17 (!) 97.2 F (36.2 C) (Oral)   03/11/17 98.6 F (37 C) (Oral)   BP Readings from Last 3 Encounters:  11/20/17 (!) 176/95  11/13/17 (!) 146/94  08/14/17 (!) 163/88   Pulse Readings from Last 3 Encounters:  11/20/17 68  11/13/17 68  08/14/17 72   Constitutional: Well-developed, well-nourished, and in no distress.   HENT: Head: Normocephalic and atraumatic.  Mouth/Throat: No oropharyngeal exudate. Mucosa moist. Eyes: Pupils are equal, round, and reactive to light. Conjunctivae are normal. No scleral icterus.  Neck: Normal range of motion. Neck supple. No JVD present.  Cardiovascular: Normal rate, regular rhythm and normal heart sounds.  Exam reveals no gallop and no friction rub.   No murmur heard. Pulmonary/Chest: Effort normal and breath sounds normal. No respiratory distress. No wheezes.No rales.  Abdominal: Soft. Bowel sounds are normal. No distension. There is no tenderness. There is no guarding.  Musculoskeletal: No edema or tenderness.  Lymphadenopathy: No cervical, axillary or supraclavicular adenopathy.  Neurological: Alert and oriented to person, place, and time. No cranial nerve deficit.  Skin: Skin is warm and dry. No rash noted. No erythema. No pallor.  Breast exam:  Chaperone present.  Left mastectomy no palpable evidence of chest wall recurrence.  Right breast shows no dominant masses.   Psychiatric: Affect and judgment normal.   Labs No visits with results within 3 Day(s) from this visit.  Latest known visit with results is:  Office Visit on 01/09/2017  Component Date Value Ref Range Status  . WBC 01/09/2017 5.6  4.0 - 10.5 K/uL Final  . RBC 01/09/2017 4.26  3.87 - 5.11 MIL/uL Final  . Hemoglobin 01/09/2017 12.9  12.0 - 15.0 g/dL Final  . HCT 01/09/2017 36.1  36.0 - 46.0 % Final  . MCV 01/09/2017 84.7  78.0 - 100.0 fL Final  . MCH 01/09/2017 30.3  26.0 - 34.0 pg Final  . MCHC 01/09/2017 35.7  30.0 - 36.0 g/dL Final  . RDW 01/09/2017 14.1  11.5 - 15.5 % Final  . Platelets 01/09/2017 339  150  - 400 K/uL Final  . Neutrophils Relative % 01/09/2017 55  % Final  . Neutro Abs 01/09/2017 3.1  1.7 - 7.7 K/uL Final  . Lymphocytes Relative 01/09/2017 32  % Final  . Lymphs Abs 01/09/2017 1.8  0.7 - 4.0 K/uL Final  . Monocytes Relative 01/09/2017 9  % Final  . Monocytes Absolute 01/09/2017 0.5  0.1 - 1.0 K/uL Final  . Eosinophils Relative 01/09/2017 3  % Final  . Eosinophils Absolute 01/09/2017 0.2  0.0 - 0.7 K/uL Final  . Basophils Relative 01/09/2017 1  % Final  . Basophils Absolute 01/09/2017 0.0  0.0 - 0.1 K/uL Final  . Sodium 01/09/2017 140  135 - 145 mmol/L Final  . Potassium 01/09/2017 3.4* 3.5 - 5.1 mmol/L Final  . Chloride 01/09/2017 104  101 - 111 mmol/L Final  . CO2 01/09/2017 25  22 - 32 mmol/L Final  . Glucose, Bld 01/09/2017 98  65 - 99 mg/dL Final  . BUN 01/09/2017 27* 6 - 20 mg/dL Final  . Creatinine, Ser 01/09/2017 1.11* 0.44 - 1.00 mg/dL Final  . Calcium 01/09/2017 10.3  8.9 - 10.3 mg/dL Final  . Total Protein 01/09/2017 7.9  6.5 - 8.1 g/dL Final  . Albumin 01/09/2017 4.2  3.5 - 5.0 g/dL Final  . AST 01/09/2017 19  15 - 41 U/L Final  . ALT 01/09/2017 18  14 - 54 U/L Final  . Alkaline  Phosphatase 01/09/2017 72  38 - 126 U/L Final  . Total Bilirubin 01/09/2017 0.5  0.3 - 1.2 mg/dL Final  . GFR calc non Af Amer 01/09/2017 53* >60 mL/min Final  . GFR calc Af Amer 01/09/2017 >60  >60 mL/min Final   Comment: (NOTE) The eGFR has been calculated using the CKD EPI equation. This calculation has not been validated in all clinical situations. eGFR's persistently <60 mL/min signify possible Chronic Kidney Disease.   . Anion gap 01/09/2017 11  5 - 15 Final     Pathology Orders Placed This Encounter  Procedures  . CBC with Differential/Platelet    Standing Status:   Future    Standing Expiration Date:   11/21/2018  . Comprehensive metabolic panel    Standing Status:   Future    Standing Expiration Date:   11/21/2018  . Lactate dehydrogenase    Standing Status:    Future    Standing Expiration Date:   11/21/2018       Zoila Shutter MD

## 2017-11-20 NOTE — Patient Instructions (Signed)
[facility] at [facility] Discharge Instructions  You saw Dr. Higgs today.   Thank you for choosing [facility] at [facility] to provide your oncology and hematology care.  To afford each patient quality time with our provider, please arrive at least 15 minutes before your scheduled appointment time.   If you have a lab appointment with the Cancer Center please come in thru the  Main Entrance and check in at the main information desk  You need to re-schedule your appointment should you arrive 10 or more minutes late.  We strive to give you quality time with our providers, and arriving late affects you and other patients whose appointments are after yours.  Also, if you no show three or more times for appointments you may be dismissed from the clinic at the providers discretion.     Again, thank you for choosing [facility].  Our hope is that these requests will decrease the amount of time that you wait before being seen by our physicians.       _____________________________________________________________  Should you have questions after your visit to  Cancer Center, please contact our office at [phone] between the hours of 8:30 a.m. and 4:30 p.m.  Voicemails left after 4:30 p.m. will not be returned until the following business day.  For prescription refill requests, have your pharmacy contact our office.       Resources For Cancer Patients and their Caregivers ? American Cancer Society: Can assist with transportation, wigs, general needs, runs Look Good Feel Better.        1-888-227-6333 ? Cancer Care: Provides financial assistance, online support groups, medication/co-pay assistance.  1-800-813-HOPE (4673) ? [facility] Assists [place] cancer patients and their families through emotional , educational and financial support.  336-427-4357 ? [place] DSS Where to apply for food  stamps, Medicaid and utility assistance. 336-342-1394 ? [facility]: Transportation to medical appointments. 336-347-2287 ? Social Security Administration: May apply for disability if have a Stage IV cancer. 336-342-7796 1-800-772-1213 ? [place] Aging, Disability and Transit Services: Assists with nutrition, care and transit needs. 336-349-2343  Cancer Center Support Programs:   > Cancer Support Group  2nd Tuesday of the month 1pm-2pm, Journey Room   > Creative Journey  3rd Tuesday of the month 1130am-1pm, Journey Room     

## 2017-12-24 DIAGNOSIS — I10 Essential (primary) hypertension: Secondary | ICD-10-CM | POA: Diagnosis not present

## 2017-12-24 DIAGNOSIS — Z6831 Body mass index (BMI) 31.0-31.9, adult: Secondary | ICD-10-CM | POA: Diagnosis not present

## 2017-12-24 DIAGNOSIS — E1165 Type 2 diabetes mellitus with hyperglycemia: Secondary | ICD-10-CM | POA: Diagnosis not present

## 2017-12-24 DIAGNOSIS — C50912 Malignant neoplasm of unspecified site of left female breast: Secondary | ICD-10-CM | POA: Diagnosis not present

## 2017-12-24 DIAGNOSIS — Z299 Encounter for prophylactic measures, unspecified: Secondary | ICD-10-CM | POA: Diagnosis not present

## 2017-12-24 DIAGNOSIS — M779 Enthesopathy, unspecified: Secondary | ICD-10-CM | POA: Diagnosis not present

## 2018-02-12 ENCOUNTER — Encounter (HOSPITAL_COMMUNITY): Payer: PPO

## 2018-02-23 ENCOUNTER — Encounter (HOSPITAL_COMMUNITY): Payer: Self-pay

## 2018-02-23 ENCOUNTER — Inpatient Hospital Stay (HOSPITAL_COMMUNITY): Payer: PPO | Attending: Internal Medicine

## 2018-02-23 VITALS — BP 180/93 | HR 63 | Temp 98.7°F | Resp 18

## 2018-02-23 DIAGNOSIS — Z170 Estrogen receptor positive status [ER+]: Secondary | ICD-10-CM | POA: Insufficient documentation

## 2018-02-23 DIAGNOSIS — C50212 Malignant neoplasm of upper-inner quadrant of left female breast: Secondary | ICD-10-CM | POA: Insufficient documentation

## 2018-02-23 DIAGNOSIS — Z780 Asymptomatic menopausal state: Secondary | ICD-10-CM

## 2018-02-23 DIAGNOSIS — Z452 Encounter for adjustment and management of vascular access device: Secondary | ICD-10-CM | POA: Insufficient documentation

## 2018-02-23 DIAGNOSIS — C50912 Malignant neoplasm of unspecified site of left female breast: Secondary | ICD-10-CM

## 2018-02-23 DIAGNOSIS — C50419 Malignant neoplasm of upper-outer quadrant of unspecified female breast: Secondary | ICD-10-CM

## 2018-02-23 MED ORDER — SODIUM CHLORIDE 0.9% FLUSH
10.0000 mL | Freq: Once | INTRAVENOUS | Status: AC
  Administered 2018-02-23: 10 mL via INTRAVENOUS

## 2018-02-23 MED ORDER — HEPARIN SOD (PORK) LOCK FLUSH 100 UNIT/ML IV SOLN
500.0000 [IU] | Freq: Once | INTRAVENOUS | Status: AC
  Administered 2018-02-23: 500 [IU] via INTRAVENOUS

## 2018-02-23 NOTE — Patient Instructions (Signed)
[facility] at [facility]  Discharge Instructions:   _______________________________________________________________  Thank you for choosing [facility] at [facility] to provide your oncology and hematology care.  To afford each patient quality time with our providers, please arrive at least 15 minutes before your scheduled appointment.  You need to re-schedule your appointment if you arrive 10 or more minutes late.  We strive to give you quality time with our providers, and arriving late affects you and other patients whose appointments are after yours.  Also, if you no show three or more times for appointments you may be dismissed from the clinic.  Again, thank you for choosing [facility] at [facility]. Our hope is that these requests will allow you access to exceptional care and in a timely manner. _______________________________________________________________  If you have questions after your visit, please contact our office at [phone] between the hours of 8:30 a.m. and 5:00 p.m. Voicemails left after 4:30 p.m. will not be returned until the following business day. _______________________________________________________________  For prescription refill requests, have your pharmacy contact our office. _______________________________________________________________  Recommendations made by the consultant and any test results will be sent to your referring physician. _______________________________________________________________ 

## 2018-02-23 NOTE — Progress Notes (Signed)
Port flushed with no complaints voiced.  Site clean and dry with good blood return noted.  No bruising or swelling noted at site.  Band aid applied.  VSs with discharge and left by wheelchair with no s/s of distress noted.

## 2018-03-03 DIAGNOSIS — C50412 Malignant neoplasm of upper-outer quadrant of left female breast: Secondary | ICD-10-CM | POA: Diagnosis not present

## 2018-03-03 DIAGNOSIS — Z170 Estrogen receptor positive status [ER+]: Secondary | ICD-10-CM | POA: Diagnosis not present

## 2018-03-16 ENCOUNTER — Inpatient Hospital Stay (HOSPITAL_COMMUNITY): Payer: PPO | Attending: Internal Medicine

## 2018-03-16 DIAGNOSIS — R531 Weakness: Secondary | ICD-10-CM | POA: Insufficient documentation

## 2018-03-16 DIAGNOSIS — F17210 Nicotine dependence, cigarettes, uncomplicated: Secondary | ICD-10-CM | POA: Insufficient documentation

## 2018-03-16 DIAGNOSIS — Z7982 Long term (current) use of aspirin: Secondary | ICD-10-CM | POA: Insufficient documentation

## 2018-03-16 DIAGNOSIS — C50912 Malignant neoplasm of unspecified site of left female breast: Secondary | ICD-10-CM | POA: Insufficient documentation

## 2018-03-16 DIAGNOSIS — Z79811 Long term (current) use of aromatase inhibitors: Secondary | ICD-10-CM | POA: Diagnosis not present

## 2018-03-16 DIAGNOSIS — Z170 Estrogen receptor positive status [ER+]: Secondary | ICD-10-CM | POA: Diagnosis not present

## 2018-03-16 DIAGNOSIS — Z9012 Acquired absence of left breast and nipple: Secondary | ICD-10-CM | POA: Diagnosis not present

## 2018-03-16 DIAGNOSIS — E119 Type 2 diabetes mellitus without complications: Secondary | ICD-10-CM | POA: Diagnosis not present

## 2018-03-16 DIAGNOSIS — M7989 Other specified soft tissue disorders: Secondary | ICD-10-CM | POA: Insufficient documentation

## 2018-03-16 DIAGNOSIS — Z79899 Other long term (current) drug therapy: Secondary | ICD-10-CM | POA: Diagnosis not present

## 2018-03-16 DIAGNOSIS — I10 Essential (primary) hypertension: Secondary | ICD-10-CM | POA: Diagnosis not present

## 2018-03-16 DIAGNOSIS — Z923 Personal history of irradiation: Secondary | ICD-10-CM | POA: Insufficient documentation

## 2018-03-16 DIAGNOSIS — Z8673 Personal history of transient ischemic attack (TIA), and cerebral infarction without residual deficits: Secondary | ICD-10-CM | POA: Diagnosis not present

## 2018-03-16 LAB — CBC WITH DIFFERENTIAL/PLATELET
Abs Immature Granulocytes: 0.01 10*3/uL (ref 0.00–0.07)
BASOS ABS: 0.1 10*3/uL (ref 0.0–0.1)
BASOS PCT: 1 %
EOS ABS: 0.1 10*3/uL (ref 0.0–0.5)
EOS PCT: 2 %
HCT: 43.7 % (ref 36.0–46.0)
Hemoglobin: 15.1 g/dL — ABNORMAL HIGH (ref 12.0–15.0)
Immature Granulocytes: 0 %
Lymphocytes Relative: 35 %
Lymphs Abs: 2.2 10*3/uL (ref 0.7–4.0)
MCH: 28.3 pg (ref 26.0–34.0)
MCHC: 34.6 g/dL (ref 30.0–36.0)
MCV: 81.8 fL (ref 80.0–100.0)
Monocytes Absolute: 0.5 10*3/uL (ref 0.1–1.0)
Monocytes Relative: 7 %
NEUTROS PCT: 55 %
NRBC: 0 % (ref 0.0–0.2)
Neutro Abs: 3.4 10*3/uL (ref 1.7–7.7)
PLATELETS: 285 10*3/uL (ref 150–400)
RBC: 5.34 MIL/uL — AB (ref 3.87–5.11)
RDW: 13.6 % (ref 11.5–15.5)
WBC: 6.3 10*3/uL (ref 4.0–10.5)

## 2018-03-16 LAB — COMPREHENSIVE METABOLIC PANEL
ALBUMIN: 4.2 g/dL (ref 3.5–5.0)
ALT: 20 U/L (ref 0–44)
ANION GAP: 8 (ref 5–15)
AST: 19 U/L (ref 15–41)
Alkaline Phosphatase: 127 U/L — ABNORMAL HIGH (ref 38–126)
BILIRUBIN TOTAL: 0.9 mg/dL (ref 0.3–1.2)
BUN: 14 mg/dL (ref 6–20)
CHLORIDE: 108 mmol/L (ref 98–111)
CO2: 26 mmol/L (ref 22–32)
Calcium: 9.7 mg/dL (ref 8.9–10.3)
Creatinine, Ser: 0.86 mg/dL (ref 0.44–1.00)
GFR calc Af Amer: >60 mL/min (ref >60)
GFR calc non Af Amer: >60 mL/min (ref >60)
Glucose, Bld: 143 mg/dL — ABNORMAL HIGH (ref 70–99)
POTASSIUM: 3.7 mmol/L (ref 3.5–5.1)
SODIUM: 142 mmol/L (ref 135–145)
TOTAL PROTEIN: 8.1 g/dL (ref 6.5–8.1)

## 2018-03-16 LAB — LACTATE DEHYDROGENASE: LDH: 136 U/L (ref 98–192)

## 2018-03-23 ENCOUNTER — Other Ambulatory Visit: Payer: Self-pay

## 2018-03-23 ENCOUNTER — Encounter (HOSPITAL_COMMUNITY): Payer: Self-pay | Admitting: Hematology

## 2018-03-23 ENCOUNTER — Inpatient Hospital Stay (HOSPITAL_BASED_OUTPATIENT_CLINIC_OR_DEPARTMENT_OTHER): Payer: PPO | Admitting: Hematology

## 2018-03-23 VITALS — BP 195/103 | HR 64 | Temp 98.1°F | Resp 16

## 2018-03-23 DIAGNOSIS — F17210 Nicotine dependence, cigarettes, uncomplicated: Secondary | ICD-10-CM

## 2018-03-23 DIAGNOSIS — Z170 Estrogen receptor positive status [ER+]: Secondary | ICD-10-CM | POA: Diagnosis not present

## 2018-03-23 DIAGNOSIS — I10 Essential (primary) hypertension: Secondary | ICD-10-CM | POA: Diagnosis not present

## 2018-03-23 DIAGNOSIS — Z79899 Other long term (current) drug therapy: Secondary | ICD-10-CM | POA: Diagnosis not present

## 2018-03-23 DIAGNOSIS — Z79811 Long term (current) use of aromatase inhibitors: Secondary | ICD-10-CM | POA: Diagnosis not present

## 2018-03-23 DIAGNOSIS — C50912 Malignant neoplasm of unspecified site of left female breast: Secondary | ICD-10-CM | POA: Diagnosis not present

## 2018-03-23 DIAGNOSIS — Z9012 Acquired absence of left breast and nipple: Secondary | ICD-10-CM

## 2018-03-23 DIAGNOSIS — Z780 Asymptomatic menopausal state: Secondary | ICD-10-CM

## 2018-03-23 DIAGNOSIS — Z7982 Long term (current) use of aspirin: Secondary | ICD-10-CM

## 2018-03-23 DIAGNOSIS — R531 Weakness: Secondary | ICD-10-CM

## 2018-03-23 DIAGNOSIS — Z8673 Personal history of transient ischemic attack (TIA), and cerebral infarction without residual deficits: Secondary | ICD-10-CM | POA: Diagnosis not present

## 2018-03-23 DIAGNOSIS — Z923 Personal history of irradiation: Secondary | ICD-10-CM

## 2018-03-23 DIAGNOSIS — E119 Type 2 diabetes mellitus without complications: Secondary | ICD-10-CM

## 2018-03-23 DIAGNOSIS — M7989 Other specified soft tissue disorders: Secondary | ICD-10-CM | POA: Diagnosis not present

## 2018-03-23 NOTE — Patient Instructions (Addendum)
[place] [facility] [place] saw Dr. Delton Coombes today. Follow up in 4 months with labs  _______________________________________________________________  Thank you for choosing [place] at [facility] to provide your oncology and hematology care.  To afford each patient quality time with our providers, please arrive at least 15 minutes before your scheduled appointment.  You need to re-schedule your appointment if you arrive 10 or more minutes late.  We strive to give you quality time with our providers, and arriving late affects you and other patients whose appointments are after yours.  Also, if you no show three or more times for appointments you may be dismissed from the clinic.  Again, thank you for choosing [place] at [place] hope is that these requests will allow you access to exceptional care and in a timely manner. _______________________________________________________________  If you have questions after your visit, please contact our office at [phone] between the hours of 8:30 a.m. and 5:00 p.m. Voicemails left after 4:30 p.m. will not be returned until the following business day. _______________________________________________________________  For prescription refill requests, have your pharmacy contact our office. _______________________________________________________________  Recommendations made by the consultant and any test results will be sent to your referring physician. _______________________________________________________________

## 2018-03-23 NOTE — Assessment & Plan Note (Signed)
1.  Stage IIb (T2N1) multifocal IDC of the left breast: - Status post left mastectomy on 12/13/2015. -Pathology showed 2.6 cm and 1.9 cm, grade 3 IDC, negative margins, no LVI, 1 out of 1 lymph node positive for Metastasis. - MammaPrint assay showed high risk of recurrence. -Adjuvant chemotherapy with 6 cycles of CMF from 03/21/2016 to 06/19/2016. - Anastrozole started around July 2018. - Left mastectomy site today was within normal limits with no suspicious nodules.  Right breast does not have any palpable masses. -She had a mammogram of the right breast on 09/04/2017 followed by a diagnostic mammogram on 09/15/2017 which was benign. - She was told to increase calcium to twice daily.  We will obtain a baseline bone density test prior to next visit in 4 months.

## 2018-03-23 NOTE — Progress Notes (Signed)
[address], [place] [address]   CLINIC:  Medical Oncology/Hematology  PCP:  System, Pcp Not In No address on file None   REASON FOR VISIT: Follow-up for Stage IIB multifocal (L) breast invasive ductal carcinoma; ER+/PR+/HER2-.  CURRENT THERAPY: She is on Arimidex which began in 11/2016.   BRIEF ONCOLOGIC HISTORY:  Oncology History   Multifocal L breast cancer     Cancer of left breast, stage 2 ([place])   07/04/2015 Mammogram    Digital mammography reveals 3 abnormal areas in the left breast, 2:00 position, 1.6 x 1.4 cm palpable mass, 10:00 position 1.7 x 1.4 cm palpable mass, upper outer quadrant 1.5-1.1 pleomorphic calcifications      07/04/2015 Initial Biopsy    Biopsies reveals invasive ductal carcinoma at the 2:00 position in the 10:00 position, ER positive greater than 90%, PR +1-5%, HER-2 negative Ki-67 marker 26%, the second tumor greater than 90% ER positive, 80% PR positive, HER-2 negative, Ki-67 marker 24%        08/17/2015 Imaging    MRI breast: 1.4 cm area of nodular and linear enhancement in the upper inner quadrant of the LEFT breast suspicious for ductal carcinoma in situ and possible invasive ductal carcinoma. There are no findings suspicious for malignancy in the right breast. 2.1 cm biopsy-proven invasive ductal carcinoma in the 10 o'clock position of the left breast. 1.7 cm biopsy-proven invasive ductal carcinoma in the 2 o'clock position of the left breast     12/13/2015 Initial Diagnosis    Breast cancer, left ([place])    12/13/2015 Surgery    Multifocal L breast cancer, L simple mastectomy with L sentinel LN mapping    12/18/2015 Pathology Results    Invasive ductal adenocarcinoma, grade 3, spanning 2.6 cm and 1.9 cm. Ductal carcinoma in situ, grade 3. Lymphovascular invasion identified. Margins of resection were negative for carcinoma. 1/1 metastatic ductal adenocarcinoma in left axillary sentinel lymph node biopsy.    12/18/2015 Pathology Results    ER 100% positive, PR 100% positive, KI-67 15%, HER-2 negative.    02/22/2016 - 06/20/2016 Chemotherapy    The patient had palonosetron (ALOXI) injection 0.25 mg, 0.25 mg, Intravenous,  Once, 3 of 6 cycles  methotrexate (PF) chemo injection 82 mg, 40 mg/m2 = 82 mg, Intravenous,  Once, 3 of 6 cycles  pegfilgrastim (NEULASTA) injection 6 mg, 6 mg, Subcutaneous, Once, 3 of 6 cycles  pegfilgrastim (NEULASTA ONPRO KIT) injection 6 mg, 6 mg, Subcutaneous, Once, 2 of 2 cycles  cyclophosphamide (CYTOXAN) 1,240 mg in sodium chloride 0.9 % 250 mL chemo infusion, 600 mg/m2 = 1,240 mg, Intravenous,  Once, 3 of 6 cycles  fluorouracil (ADRUCIL) chemo injection 1,250 mg, 600 mg/m2 = 1,250 mg, Intravenous,  Once, 3 of 6 cycles  for chemotherapy treatment.      07/29/2016 - 09/17/2016 Radiation Therapy    Adjuvant radiation therapy (done in [place] at [facility])    11/2016 -  Anti-estrogen oral therapy    Anastrozole daily. Planned duration of treatment 5-10 years.       CANCER STAGING: Cancer Staging Cancer of left breast, stage 2 (HCC) Staging form: Breast, AJCC 7th Edition - Clinical stage from 01/26/2016: Stage IIB (T2(2), N1, M0) - Signed by Patrici Ranks, MD on 01/26/2016    INTERVAL HISTORY:  Ms. Holcomb 62 y.o. female returns for routine follow-up for stage II left breast cancer. She is tolerating her Anastrozole with very few side effects. Her blood pressure was a  little elevated today. She has right sided weakness due to 2 CVAs in the past. She has her mammogram recently. She will have her Dexa scan in march. She experiences bilateral leg swelling which resolves when elevated. She denies any nausea, vomiting, or diarrhea. Denies any new pains or lumps present. Denies any easy bruising or bleeding. She reports her appetite and energy level at 75% and she has no problem maintaining her weight.    REVIEW OF SYSTEMS:  Review of Systems  Cardiovascular:  Positive for leg swelling.  All other systems reviewed and are negative.    PAST MEDICAL/SURGICAL HISTORY:  Past Medical History:  Diagnosis Date  . Arthritis   . Cancer [facility])    left breast cancer  . Cancer of left breast, stage 2 ([place]) 12/13/2015  . Diabetes mellitus without complication ([place])   . Hypertension   . Pneumonia   . Seizures ([place])   . Stroke [facility])    2 strokes, right side weakness   Past Surgical History:  Procedure Laterality Date  . ABDOMINAL HYSTERECTOMY    . FOOT SURGERY Right   . PORTACATH PLACEMENT Right 02/14/2016   Procedure: INSERTION PORT-A-CATH;  Surgeon: Vickie Epley, MD;  Location: [facility];  Service: Vascular;  Laterality: Right;  . SIMPLE MASTECTOMY WITH AXILLARY SENTINEL NODE BIOPSY Left 12/13/2015   Procedure: LEFT SIMPLE MASTECTOMY WITH AXILLARY SENTINEL NODE BIOPSY;  Surgeon: Erroll Luna, MD;  Location: [facility];  Service: General;  Laterality: Left;  . TUBAL LIGATION       SOCIAL HISTORY:  Social History   Socioeconomic History  . Marital status: Widowed    Spouse name: Not on file  . Number of children: Not on file  . Years of education: Not on file  . Highest education level: Not on file  Occupational History  . Not on file  Social Needs  . Financial resource strain: Not on file  . Food insecurity:    Worry: Not on file    Inability: Not on file  . Transportation needs:    Medical: Not on file    Non-medical: Not on file  Tobacco Use  . Smoking status: Current Every Day Smoker    Packs/day: 0.25    Years: 20.00    Pack years: 5.00    Types: Cigarettes  . Smokeless tobacco: Never Used  Substance and Sexual Activity  . Alcohol use: No  . Drug use: No  . Sexual activity: Not Currently    Birth control/protection: Surgical  Lifestyle  . Physical activity:    Days per week: Not on file    Minutes per session: Not on file  . Stress: Not on file  Relationships  . Social connections:    Talks on phone: Not on file     Gets together: Not on file    Attends religious service: Not on file    Active member of club or organization: Not on file    Attends meetings of clubs or organizations: Not on file    Relationship status: Not on file  . Intimate partner violence:    Fear of current or ex partner: Not on file    Emotionally abused: Not on file    Physically abused: Not on file    Forced sexual activity: Not on file  Other Topics Concern  . Not on file  Social History Narrative  . Not on file    FAMILY HISTORY:  Family History  Problem Relation Age of Onset  .  Hypertension Mother   . Diabetes Mother   . Other Father        sleeping disease    CURRENT MEDICATIONS:  Outpatient Encounter Medications as of 03/23/2018  Medication Sig Note  . amLODipine (NORVASC) 10 MG tablet Take 10 mg by mouth daily.   Marland Kitchen anastrozole (ARIMIDEX) 1 MG tablet Take 1 tablet (1 mg total) by mouth daily.   Marland Kitchen atenolol (TENORMIN) 50 MG tablet Take 1 tablet by mouth daily.   . baclofen (LIORESAL) 10 MG tablet Take 1 tablet by mouth 3 (three) times daily as needed for muscle spasms.    . benazepril (LOTENSIN) 40 MG tablet  03/03/2016: Received from: External Pharmacy  . calcium-vitamin D (OSCAL WITH D) 500-200 MG-UNIT tablet Take 2 tablets by mouth daily with breakfast.   . chlorthalidone (HYGROTON) 25 MG tablet Take 1 tablet by mouth daily.   . clopidogrel (PLAVIX) 75 MG tablet Take 1 tablet by mouth daily.   . CYCLOPHOSPHAMIDE IV Inject into the vein. Every 21 days 01/29/2016: Has not started yet.  Marland Kitchen dexamethasone (DECADRON) 4 MG tablet Take 2 tablets (8 mg total) by mouth daily. Start the day after chemotherapy for 2 days. Take with food. 01/29/2016: Has not started yet.  . fluorouracil (ADRUCIL) 2.5 GM/50ML SOLN Inject into the vein once. Injection every 21 days 01/29/2016: Has not started yet.  Marland Kitchen JANUVIA 100 MG tablet Take 100 mg by mouth daily.   Marland Kitchen lidocaine-prilocaine (EMLA) cream Apply to affected area once 01/29/2016:  Has not started yet.  Marland Kitchen LORazepam (ATIVAN) 0.5 MG tablet Take 1 tablet by mouth daily as needed for anxiety.  01/29/2016: Has not started yet.  . metFORMIN (GLUCOPHAGE) 1000 MG tablet Take 1,000 mg by mouth 2 (two) times daily.   . methotrexate (PF) in sodium chloride 0.9 % 500 mL Inject into the vein once. Every 21 days 01/29/2016: Has not started yet.  . ondansetron (ZOFRAN) 8 MG tablet Take 1 tablet (8 mg total) by mouth 2 (two) times daily as needed for refractory nausea / vomiting. Start on day 3 after chemotherapy. 01/29/2016: Has not started yet.  . ondansetron (ZOFRAN-ODT) 4 MG disintegrating tablet Take 1 tablet (4 mg total) by mouth every 6 (six) hours as needed for nausea. 01/29/2016: Has not started yet.  Marland Kitchen oxyCODONE (OXY IR/ROXICODONE) 5 MG immediate release tablet Take 1-2 tablets (5-10 mg total) by mouth every 4 (four) hours as needed for moderate pain. 01/29/2016: Has not started yet.  . potassium chloride (K-DUR) 10 MEQ tablet Take 1 tablet (10 mEq total) by mouth 3 (three) times daily.   . potassium chloride (K-DUR,KLOR-CON) 10 MEQ tablet Take 10 mEq by mouth 2 (two) times daily.   . prochlorperazine (COMPAZINE) 10 MG tablet Take 1 tablet (10 mg total) by mouth every 6 (six) hours as needed (Nausea or vomiting). 01/29/2016: Has not started yet.  . [DISCONTINUED] aspirin 81 MG EC tablet Take 1 tablet by mouth daily.    No facility-administered encounter medications on file as of 03/23/2018.     ALLERGIES:  Allergies  Allergen Reactions  . No Known Allergies Other (See Comments)     PHYSICAL EXAM:  ECOG Performance status: 1  Vitals:   03/23/18 1137  BP: (!) 195/103  Pulse: 64  Resp: 16  Temp: 98.1 F (36.7 C)  SpO2: 98%   There were no vitals filed for this visit.  Physical Exam  Constitutional: She is oriented to person, place, and time. She appears well-developed and  well-nourished.  Musculoskeletal: Normal range of motion.  Neurological: She is alert and oriented  to person, place, and time.  Skin: Skin is warm and dry.  Psychiatric: She has a normal mood and affect. Her behavior is normal. Judgment and thought content normal.  Breast: Left mastectomy site is within normal limits.  Right breast has no palpable masses, no skin changes or nipple discharge, no adenopathy.   LABORATORY DATA:  I have reviewed the labs as listed.  CBC    Component Value Date/Time   WBC 6.3 03/16/2018 1245   RBC 5.34 (H) 03/16/2018 1245   HGB 15.1 (H) 03/16/2018 1245   HCT 43.7 03/16/2018 1245   PLT 285 03/16/2018 1245   MCV 81.8 03/16/2018 1245   MCH 28.3 03/16/2018 1245   MCHC 34.6 03/16/2018 1245   RDW 13.6 03/16/2018 1245   LYMPHSABS 2.2 03/16/2018 1245   MONOABS 0.5 03/16/2018 1245   EOSABS 0.1 03/16/2018 1245   BASOSABS 0.1 03/16/2018 1245   CMP Latest Ref Rng & Units 03/16/2018 01/09/2017 06/19/2016  Glucose 70 - 99 mg/dL 143(H) 98 156(H)  BUN 6 - 20 mg/dL 14 27(H) 13  Creatinine 0.44 - 1.00 mg/dL 0.86 1.11(H) 0.88  Sodium 135 - 145 mmol/L 142 140 137  Potassium 3.5 - 5.1 mmol/L 3.7 3.4(L) 3.6  Chloride 98 - 111 mmol/L 108 104 104  CO2 22 - 32 mmol/L _0 Calcium 8.9 - 10.3 mg/dL 9.7 10.3 9.0  Total Protein 6.5 - 8.1 g/dL 8.1 7.9 7.1  Total Bilirubin 0.3 - 1.2 mg/dL 0.9 0.5 0.7  Alkaline Phos 38 - 126 U/L 127(H) 72 123  AST 15 - 41 U/L _1 ALT 0 - 44 U/L _2 ASSESSMENT & PLAN:   Cancer of left breast, stage 2 (HCC) 1.  Stage IIb (T2N1) multifocal IDC of the left breast: - Status post left mastectomy on 12/13/2015. -Pathology showed 2.6 cm and 1.9 cm, grade 3 IDC, negative margins, no LVI, 1 out of 1 lymph node positive for Metastasis. - MammaPrint assay showed high risk of recurrence. -Adjuvant chemotherapy with 6 cycles of CMF from 03/21/2016 to 06/19/2016. - Anastrozole started around July 2018. - Left mastectomy site today was within normal limits with no suspicious nodules.  Right breast does not have any  palpable masses. -She had a mammogram of the right breast on 09/04/2017 followed by a diagnostic mammogram on 09/15/2017 which was benign. - She was told to increase calcium to twice daily.  We will obtain a baseline bone density test prior to next visit in 4 months.      Orders placed this encounter:  Orders Placed This Encounter  Procedures  . DG Bone Density  . CBC with Differential/Platelet  . Comprehensive metabolic panel  . Lactate dehydrogenase      Derek Jack, MD [place] [phone]

## 2018-05-17 ENCOUNTER — Inpatient Hospital Stay (HOSPITAL_COMMUNITY): Payer: Medicare Other | Attending: Hematology

## 2018-05-17 DIAGNOSIS — Z452 Encounter for adjustment and management of vascular access device: Secondary | ICD-10-CM | POA: Diagnosis not present

## 2018-05-17 DIAGNOSIS — C50412 Malignant neoplasm of upper-outer quadrant of left female breast: Secondary | ICD-10-CM | POA: Diagnosis not present

## 2018-05-17 DIAGNOSIS — Z170 Estrogen receptor positive status [ER+]: Secondary | ICD-10-CM | POA: Diagnosis not present

## 2018-05-17 MED ORDER — SODIUM CHLORIDE 0.9% FLUSH
10.0000 mL | Freq: Once | INTRAVENOUS | Status: AC
  Administered 2018-05-17: 10 mL via INTRAVENOUS

## 2018-05-17 MED ORDER — HEPARIN SOD (PORK) LOCK FLUSH 100 UNIT/ML IV SOLN
500.0000 [IU] | Freq: Once | INTRAVENOUS | Status: AC
  Administered 2018-05-17: 500 [IU] via INTRAVENOUS

## 2018-05-17 NOTE — Progress Notes (Signed)
Julie Parker presented for Portacath access and flush.  Proper placement of portacath confirmed by CXR.  Portacath located right chest wall accessed with  H 20 needle.  No blood return but flushes easily without swelling or pain. Portacath flushed with 60ml NS and 500U/74ml Heparin and needle removed intact.  Procedure tolerated well and without incident.  Pts BP elevated, she states she did not take her BP meds again this morning. I informed her that she needs to take them as soon as she gets home and that she needs to start taking her medications in the morning when she gets up. Verbalizes understanding. Discharged self ambulatory in satisfactory condition.

## 2018-05-17 NOTE — Patient Instructions (Signed)
[facility]  Discharge Instructions:  Your port was flushed today. _______________________________________________________________  Thank you for choosing [facility] to provide your oncology and hematology care.  To afford each patient quality time with our providers, please arrive at least 15 minutes before your scheduled appointment.  You need to re-schedule your appointment if you arrive 10 or more minutes late.  We strive to give you quality time with our providers, and arriving late affects you and other patients whose appointments are after yours.  Also, if you no show three or more times for appointments you may be dismissed from the clinic.  Again, thank you for choosing [facility]. Our hope is that these requests will allow you access to exceptional care and in a timely manner. _______________________________________________________________  If you have questions after your visit, please contact our office at [phone] between the hours of 8:30 a.m. and 5:00 p.m. Voicemails left after 4:30 p.m. will not be returned until the following business day. _______________________________________________________________  For prescription refill requests, have your pharmacy contact our office. _______________________________________________________________  Recommendations made by the consultant and any test results will be sent to your referring physician. _______________________________________________________________ 

## 2018-07-13 NOTE — Congregational Nurse Program (Signed)
  Dept: [phone]   Congregational Nurse Program Note  Date of Encounter: 07/13/2018  Past Medical History: Past Medical History:  Diagnosis Date  . Arthritis   . Cancer [facility])    left breast cancer  . Cancer of left breast, stage 2 ([place]) 12/13/2015  . Diabetes mellitus without complication ([place])   . Hypertension   . Pneumonia   . Seizures ([place])   . Stroke [facility])    2 strokes, right side weakness    Encounter Details: CNP Questionnaire - 07/13/18 1145      Questionnaire   Patient Status  Not Applicable    Race  Black or African American    Location Patient [place]  Medicare    Uninsured  Not Applicable    Food  Yes, have food insecurities    Housing/Utilities  Yes, have permanent housing    Transportation  No transportation needs    Interpersonal Safety  Yes, feel physically and emotionally safe where you currently live    Medication  No medication insecurities    Medical Provider  Yes    Referrals  Not Applicable    ED Visit Averted  Not Applicable    Life-Saving Intervention Made  Not Applicable     30/13/1438  History of having two strokes, walking with walker,still drives, on her way now to get her medication refilled. Blood Pressure 190/132 P 96. Advised the importance of taking her medication as directed And not skip a dose .  Marko Plume [phone]

## 2018-07-15 ENCOUNTER — Other Ambulatory Visit (HOSPITAL_COMMUNITY): Payer: Self-pay | Admitting: *Deleted

## 2018-07-15 MED ORDER — ANASTROZOLE 1 MG PO TABS: 1.0000 mg | ORAL_TABLET | Freq: Every day | ORAL | 2 refills | Status: AC

## 2018-07-19 ENCOUNTER — Encounter (HOSPITAL_COMMUNITY): Payer: Self-pay

## 2018-07-19 ENCOUNTER — Inpatient Hospital Stay (HOSPITAL_COMMUNITY): Payer: Medicare Other | Attending: Hematology

## 2018-07-19 ENCOUNTER — Ambulatory Visit (HOSPITAL_COMMUNITY)
Admission: RE | Admit: 2018-07-19 | Discharge: 2018-07-19 | Disposition: A | Discharge status: Home / Self Care | Payer: Medicare Other | Source: Ambulatory Visit | Attending: Nurse Practitioner | Admitting: Nurse Practitioner

## 2018-07-19 ENCOUNTER — Other Ambulatory Visit: Payer: Self-pay

## 2018-07-19 DIAGNOSIS — Z170 Estrogen receptor positive status [ER+]: Secondary | ICD-10-CM | POA: Diagnosis not present

## 2018-07-19 DIAGNOSIS — Z780 Asymptomatic menopausal state: Secondary | ICD-10-CM

## 2018-07-19 DIAGNOSIS — Z452 Encounter for adjustment and management of vascular access device: Secondary | ICD-10-CM | POA: Diagnosis not present

## 2018-07-19 DIAGNOSIS — C50412 Malignant neoplasm of upper-outer quadrant of left female breast: Secondary | ICD-10-CM | POA: Diagnosis present

## 2018-07-19 DIAGNOSIS — C50912 Malignant neoplasm of unspecified site of left female breast: Secondary | ICD-10-CM

## 2018-07-19 LAB — CBC WITH DIFFERENTIAL/PLATELET
Abs Immature Granulocytes: 0.02 10*3/uL (ref 0.00–0.07)
BASOS PCT: 1 %
Basophils Absolute: 0.1 10*3/uL (ref 0.0–0.1)
Eosinophils Absolute: 0.2 10*3/uL (ref 0.0–0.5)
Eosinophils Relative: 2 %
HEMATOCRIT: 41.3 % (ref 36.0–46.0)
HEMOGLOBIN: 14.8 g/dL (ref 12.0–15.0)
IMMATURE GRANULOCYTES: 0 %
LYMPHS ABS: 2.2 10*3/uL (ref 0.7–4.0)
LYMPHS PCT: 30 %
MCH: 28.8 pg (ref 26.0–34.0)
MCHC: 35.8 g/dL (ref 30.0–36.0)
MCV: 80.4 fL (ref 80.0–100.0)
MONOS PCT: 7 %
Monocytes Absolute: 0.5 10*3/uL (ref 0.1–1.0)
NEUTROS ABS: 4.4 10*3/uL (ref 1.7–7.7)
NEUTROS PCT: 60 %
PLATELETS: 288 10*3/uL (ref 150–400)
RBC: 5.14 MIL/uL — ABNORMAL HIGH (ref 3.87–5.11)
RDW: 13.2 % (ref 11.5–15.5)
WBC: 7.4 10*3/uL (ref 4.0–10.5)
nRBC: 0 % (ref 0.0–0.2)

## 2018-07-19 LAB — COMPREHENSIVE METABOLIC PANEL
ALBUMIN: 3.9 g/dL (ref 3.5–5.0)
ALT: 19 U/L (ref 0–44)
ANION GAP: 9 (ref 5–15)
AST: 17 U/L (ref 15–41)
Alkaline Phosphatase: 134 U/L — ABNORMAL HIGH (ref 38–126)
BUN: 12 mg/dL (ref 8–23)
CO2: 24 mmol/L (ref 22–32)
Calcium: 9.3 mg/dL (ref 8.9–10.3)
Chloride: 106 mmol/L (ref 98–111)
Creatinine, Ser: 0.77 mg/dL (ref 0.44–1.00)
GFR calc Af Amer: >60 mL/min (ref >60)
GFR calc non Af Amer: >60 mL/min (ref >60)
GLUCOSE: 126 mg/dL — AB (ref 70–99)
POTASSIUM: 3.5 mmol/L (ref 3.5–5.1)
SODIUM: 139 mmol/L (ref 135–145)
Total Bilirubin: 0.5 mg/dL (ref 0.3–1.2)
Total Protein: 7.6 g/dL (ref 6.5–8.1)

## 2018-07-19 LAB — LACTATE DEHYDROGENASE: LDH: 139 U/L (ref 98–192)

## 2018-07-19 MED ORDER — SODIUM CHLORIDE 0.9% FLUSH
10.0000 mL | Freq: Once | INTRAVENOUS | Status: AC
  Administered 2018-07-19: 10 mL via INTRAVENOUS

## 2018-07-19 MED ORDER — HEPARIN SOD (PORK) LOCK FLUSH 100 UNIT/ML IV SOLN
500.0000 [IU] | Freq: Once | INTRAVENOUS | Status: AC
  Administered 2018-07-19: 500 [IU] via INTRAVENOUS

## 2018-07-19 NOTE — Progress Notes (Signed)
Julie Parker presented for Portacath access and flush. Portacath located right chest wall accessed with  H 20 needle. No blood return and no resistance met.  Portacath flushed with 75m NS and 500U/532mHeparin and needle removed intact. Procedure without incident. Patient tolerated procedure well.  Drew labs peripherally due to no blood return.   Treatment given per orders. Patient tolerated it well without problems. Vitals stable and discharged home from clinic via wheelchair.  Follow up as scheduled.   Blood pressure also elevated today. Patient stated "she had not taken her medication today" I informed her that she needs to take it as soon as she got home!

## 2018-07-26 ENCOUNTER — Other Ambulatory Visit: Payer: Self-pay

## 2018-07-26 ENCOUNTER — Inpatient Hospital Stay (HOSPITAL_COMMUNITY): Payer: Medicare Other | Attending: Hematology | Admitting: Hematology

## 2018-07-26 ENCOUNTER — Encounter (HOSPITAL_COMMUNITY): Payer: Self-pay | Admitting: Hematology

## 2018-07-26 VITALS — BP 183/102 | HR 58 | Temp 97.6°F | Wt 176.9 lb

## 2018-07-26 DIAGNOSIS — Z8673 Personal history of transient ischemic attack (TIA), and cerebral infarction without residual deficits: Secondary | ICD-10-CM | POA: Insufficient documentation

## 2018-07-26 DIAGNOSIS — Z170 Estrogen receptor positive status [ER+]: Secondary | ICD-10-CM

## 2018-07-26 DIAGNOSIS — Z79811 Long term (current) use of aromatase inhibitors: Secondary | ICD-10-CM | POA: Diagnosis not present

## 2018-07-26 DIAGNOSIS — M8580 Other specified disorders of bone density and structure, unspecified site: Secondary | ICD-10-CM | POA: Diagnosis not present

## 2018-07-26 DIAGNOSIS — Z923 Personal history of irradiation: Secondary | ICD-10-CM | POA: Diagnosis not present

## 2018-07-26 DIAGNOSIS — C50412 Malignant neoplasm of upper-outer quadrant of left female breast: Secondary | ICD-10-CM | POA: Diagnosis not present

## 2018-07-26 DIAGNOSIS — G40909 Epilepsy, unspecified, not intractable, without status epilepticus: Secondary | ICD-10-CM

## 2018-07-26 DIAGNOSIS — I10 Essential (primary) hypertension: Secondary | ICD-10-CM | POA: Insufficient documentation

## 2018-07-26 DIAGNOSIS — Z79899 Other long term (current) drug therapy: Secondary | ICD-10-CM | POA: Insufficient documentation

## 2018-07-26 DIAGNOSIS — F17210 Nicotine dependence, cigarettes, uncomplicated: Secondary | ICD-10-CM | POA: Diagnosis not present

## 2018-07-26 DIAGNOSIS — E119 Type 2 diabetes mellitus without complications: Secondary | ICD-10-CM | POA: Diagnosis not present

## 2018-07-26 DIAGNOSIS — Z9221 Personal history of antineoplastic chemotherapy: Secondary | ICD-10-CM | POA: Diagnosis not present

## 2018-07-26 DIAGNOSIS — Z9012 Acquired absence of left breast and nipple: Secondary | ICD-10-CM | POA: Diagnosis not present

## 2018-07-26 DIAGNOSIS — M129 Arthropathy, unspecified: Secondary | ICD-10-CM | POA: Diagnosis not present

## 2018-07-26 DIAGNOSIS — C50912 Malignant neoplasm of unspecified site of left female breast: Secondary | ICD-10-CM

## 2018-07-26 NOTE — Assessment & Plan Note (Signed)
1.  Stage IIb (T2N1) multifocal IDC of the left breast, ER/PR positive, HER-2 negative: -Status post left mastectomy on 12/13/2015, pathology showing 2.6 cm and 1.9 cm, grade 3 IDC, negative margins, no alvei, 1 out of 1 lymph node positive for metastasis, Ki-67 of 15%.  - MammaPrint assay showed high risk of recurrence.  -Adjuvant chemotherapy with 6 cycles of CMF from 03/21/2016 to 06/19/2016. - Anastrozole started around July 2018.  - She is tolerating anastrozole very well without any hot flashes or musculoskeletal symptoms. -Today's examination shows normal left mastectomy site and right breast has no palpable masses.  No palpable adenopathy. -Last mammogram of the right breast was on 09/04/2017.  Diagnostic mammogram on 09/15/2017 was benign. -I have reviewed her blood work.  Her alk phos is elevated for the last 2 times. -I have recommended imaging of the liver in the form of CT of the abdomen and a bone scan. -We will see her back after the scans to discuss the results.  2.  Osteopenia: - We discussed the results of the DEXA scan dated 07/19/2018 which shows T score of -1.4. -I have told her to take calcium with vitamin D twice daily. -We will plan to repeat bone density test in 2 years. Marland Kitchen

## 2018-07-26 NOTE — Progress Notes (Signed)
[place],  [id number]   CLINIC:  Medical Oncology/Hematology  PCP:  System, Pcp Not In No address on file None   REASON FOR VISIT:  Follow-up for Stage IIB multifocal (L) breast invasive ductal carcinoma; ER+/PR+/HER2-.  CURRENT THERAPY: She is on Arimidex which began in 11/2016.   BRIEF ONCOLOGIC HISTORY:  Oncology History   Multifocal L breast cancer     Cancer of left breast, stage 2 ([place])   07/04/2015 Mammogram    Digital mammography reveals 3 abnormal areas in the left breast, 2:00 position, 1.6 x 1.4 cm palpable mass, 10:00 position 1.7 x 1.4 cm palpable mass, upper outer quadrant 1.5-1.1 pleomorphic calcifications      07/04/2015 Initial Biopsy    Biopsies reveals invasive ductal carcinoma at the 2:00 position in the 10:00 position, ER positive greater than 90%, PR +1-5%, HER-2 negative Ki-67 marker 26%, the second tumor greater than 90% ER positive, 80% PR positive, HER-2 negative, Ki-67 marker 24%        08/17/2015 Imaging    MRI breast: 1.4 cm area of nodular and linear enhancement in the upper inner quadrant of the LEFT breast suspicious for ductal carcinoma in situ and possible invasive ductal carcinoma. There are no findings suspicious for malignancy in the right breast. 2.1 cm biopsy-proven invasive ductal carcinoma in the 10 o'clock position of the left breast. 1.7 cm biopsy-proven invasive ductal carcinoma in the 2 o'clock position of the left breast     12/13/2015 Initial Diagnosis    Breast cancer, left ([place])    12/13/2015 Surgery    Multifocal L breast cancer, L simple mastectomy with L sentinel LN mapping    12/18/2015 Pathology Results    Invasive ductal adenocarcinoma, grade 3, spanning 2.6 cm and 1.9 cm. Ductal carcinoma in situ, grade 3. Lymphovascular invasion identified. Margins of resection were negative for carcinoma. 1/1 metastatic ductal adenocarcinoma in left axillary sentinel lymph node biopsy.    12/18/2015 Pathology Results    ER 100% positive, PR 100% positive, KI-67 15%, HER-2 negative.    02/22/2016 - 06/20/2016 Chemotherapy    The patient had palonosetron (ALOXI) injection 0.25 mg, 0.25 mg, Intravenous,  Once, 3 of 6 cycles  methotrexate (PF) chemo injection 82 mg, 40 mg/m2 = 82 mg, Intravenous,  Once, 3 of 6 cycles  pegfilgrastim (NEULASTA) injection 6 mg, 6 mg, Subcutaneous, Once, 3 of 6 cycles  pegfilgrastim (NEULASTA ONPRO KIT) injection 6 mg, 6 mg, Subcutaneous, Once, 2 of 2 cycles  cyclophosphamide (CYTOXAN) 1,240 mg in sodium chloride 0.9 % 250 mL chemo infusion, 600 mg/m2 = 1,240 mg, Intravenous,  Once, 3 of 6 cycles  fluorouracil (ADRUCIL) chemo injection 1,250 mg, 600 mg/m2 = 1,250 mg, Intravenous,  Once, 3 of 6 cycles  for chemotherapy treatment.      07/29/2016 - 09/17/2016 Radiation Therapy    Adjuvant radiation therapy (done in [place] at [facility])    11/2016 -  Anti-estrogen oral therapy    Anastrozole daily. Planned duration of treatment 5-10 years.       CANCER STAGING: Cancer Staging Cancer of left breast, stage 2 (HCC) Staging form: Breast, AJCC 7th Edition - Clinical stage from 01/26/2016: Stage IIB (T2(2), N1, M0) - Signed by Patrici Ranks, MD on 01/26/2016    INTERVAL HISTORY:  Ms. Evon 63 y.o. female returns for routine follow-up. She is here today with. She states that she had therapy this morning at [facility] and did not take her  BP medication. She is currently taking anastrozole as directed, with no missed doses. She states that she is also taking calcium and vit d daily. Denies any nausea, vomiting, or diarrhea. Denies any new pains. Had not noticed any recent bleeding such as epistaxis, hematuria or hematochezia. Denies recent chest pain on exertion, shortness of breath on minimal exertion, pre-syncopal episodes, or palpitations. Denies any numbness or tingling in hands or feet. Denies any recent fevers, infections, or recent hospitalizations.  Patient reports appetite at 100% and energy level at 50%.    REVIEW OF SYSTEMS:  Review of Systems  All other systems reviewed and are negative.    PAST MEDICAL/SURGICAL HISTORY:  Past Medical History:  Diagnosis Date   Arthritis    Cancer ([place])    left breast cancer   Cancer of left breast, stage 2 ([place]) 12/13/2015   Diabetes mellitus without complication (HCC)    Hypertension    Pneumonia    Seizures ([place])    Stroke ([place])    2 strokes, right side weakness   Past Surgical History:  Procedure Laterality Date   ABDOMINAL HYSTERECTOMY     FOOT SURGERY Right    PORTACATH PLACEMENT Right 02/14/2016   Procedure: INSERTION PORT-A-CATH;  Surgeon: Vickie Epley, MD;  Location: [facility];  Service: Vascular;  Laterality: Right;   SIMPLE MASTECTOMY WITH AXILLARY SENTINEL NODE BIOPSY Left 12/13/2015   Procedure: LEFT SIMPLE MASTECTOMY WITH AXILLARY SENTINEL NODE BIOPSY;  Surgeon: Erroll Luna, MD;  Location: [facility];  Service: General;  Laterality: Left;   TUBAL LIGATION       SOCIAL HISTORY:  Social History   Socioeconomic History   Marital status: Widowed    Spouse name: Not on file   Number of children: Not on file   Years of education: Not on file   Highest education level: Not on file  Occupational History   Not on file  Social Needs   Financial resource strain: Not on file   Food insecurity:    Worry: Not on file    Inability: Not on file   Transportation needs:    Medical: Not on file    Non-medical: Not on file  Tobacco Use   Smoking status: Current Every Day Smoker    Packs/day: 0.25    Years: 20.00    Pack years: 5.00    Types: Cigarettes   Smokeless tobacco: Never Used  Substance and Sexual Activity   Alcohol use: No   Drug use: No   Sexual activity: Not Currently    Birth control/protection: Surgical  Lifestyle   Physical activity:    Days per week: Not on file    Minutes per session: Not on file   Stress: Not on file    Relationships   Social connections:    Talks on phone: Not on file    Gets together: Not on file    Attends religious service: Not on file    Active member of club or organization: Not on file    Attends meetings of clubs or organizations: Not on file    Relationship status: Not on file   Intimate partner violence:    Fear of current or ex partner: Not on file    Emotionally abused: Not on file    Physically abused: Not on file    Forced sexual activity: Not on file  Other Topics Concern   Not on file  Social History Narrative   Not on file    FAMILY  HISTORY:  Family History  Problem Relation Age of Onset   Hypertension Mother    Diabetes Mother    Other Father        sleeping disease    CURRENT MEDICATIONS:  Outpatient Encounter Medications as of 07/26/2018  Medication Sig Note   amLODipine (NORVASC) 10 MG tablet Take 10 mg by mouth daily.    anastrozole (ARIMIDEX) 1 MG tablet Take 1 tablet (1 mg total) by mouth daily.    atenolol (TENORMIN) 50 MG tablet Take 1 tablet by mouth daily.    baclofen (LIORESAL) 10 MG tablet Take 1 tablet by mouth 3 (three) times daily as needed for muscle spasms.     benazepril (LOTENSIN) 40 MG tablet Take 40 mg by mouth daily.  03/03/2016: Received from: External Pharmacy   calcium-vitamin D (OSCAL WITH D) 500-200 MG-UNIT tablet Take 2 tablets by mouth daily with breakfast.    chlorthalidone (HYGROTON) 25 MG tablet Take 1 tablet by mouth daily.    clopidogrel (PLAVIX) 75 MG tablet Take 1 tablet by mouth daily.    CYCLOPHOSPHAMIDE IV Inject into the vein. Every 21 days 01/29/2016: Has not started yet.   dexamethasone (DECADRON) 4 MG tablet Take 2 tablets (8 mg total) by mouth daily. Start the day after chemotherapy for 2 days. Take with food. 01/29/2016: Has not started yet.   fluorouracil (ADRUCIL) 2.5 GM/50ML SOLN Inject into the vein once. Injection every 21 days 01/29/2016: Has not started yet.   JANUVIA 100 MG tablet Take  100 mg by mouth daily.    lidocaine-prilocaine (EMLA) cream Apply to affected area once 01/29/2016: Has not started yet.   LORazepam (ATIVAN) 0.5 MG tablet Take 1 tablet by mouth daily as needed for anxiety.  01/29/2016: Has not started yet.   metFORMIN (GLUCOPHAGE) 1000 MG tablet Take 1,000 mg by mouth 2 (two) times daily.    methotrexate (PF) in sodium chloride 0.9 % 500 mL Inject into the vein once. Every 21 days 01/29/2016: Has not started yet.   ondansetron (ZOFRAN) 8 MG tablet Take 1 tablet (8 mg total) by mouth 2 (two) times daily as needed for refractory nausea / vomiting. Start on day 3 after chemotherapy. 01/29/2016: Has not started yet.   ondansetron (ZOFRAN-ODT) 4 MG disintegrating tablet Take 1 tablet (4 mg total) by mouth every 6 (six) hours as needed for nausea. 01/29/2016: Has not started yet.   potassium chloride (K-DUR,KLOR-CON) 10 MEQ tablet Take 10 mEq by mouth 2 (two) times daily.    prochlorperazine (COMPAZINE) 10 MG tablet Take 1 tablet (10 mg total) by mouth every 6 (six) hours as needed (Nausea or vomiting). 01/29/2016: Has not started yet.   oxyCODONE (OXY IR/ROXICODONE) 5 MG immediate release tablet Take 1-2 tablets (5-10 mg total) by mouth every 4 (four) hours as needed for moderate pain. (Patient not taking: Reported on 07/26/2018) 01/29/2016: Has not started yet.   potassium chloride (K-DUR) 10 MEQ tablet Take 1 tablet (10 mEq total) by mouth 3 (three) times daily.    No facility-administered encounter medications on file as of 07/26/2018.     ALLERGIES:  Allergies  Allergen Reactions   No Known Allergies Other (See Comments)     PHYSICAL EXAM:  ECOG Performance status: 2  Vitals:   07/26/18 1512  BP: (!) 183/102  Pulse: (!) 58  Temp: 97.6 F (36.4 C)  SpO2: 99%   Filed Weights   07/26/18 1512  Weight: 176 lb 14.4 oz (80.2 kg)    Physical  Exam Constitutional:      Appearance: Normal appearance.  Cardiovascular:     Rate and Rhythm: Normal rate  and regular rhythm.     Heart sounds: Normal heart sounds.  Pulmonary:     Effort: Pulmonary effort is normal.     Breath sounds: Normal breath sounds.  Abdominal:     Palpations: Abdomen is soft. There is no mass.  Musculoskeletal:        General: No swelling.  Skin:    General: Skin is warm.  Neurological:     General: No focal deficit present.     Mental Status: She is alert and oriented to person, place, and time.  Psychiatric:        Mood and Affect: Mood normal.        Behavior: Behavior normal.   Left mastectomy site is within normal limits.  Right breast has no palpable mass.  No palpable adenopathy.   LABORATORY DATA:  I have reviewed the labs as listed.  CBC    Component Value Date/Time   WBC 7.4 07/19/2018 1303   RBC 5.14 (H) 07/19/2018 1303   HGB 14.8 07/19/2018 1303   HCT 41.3 07/19/2018 1303   PLT 288 07/19/2018 1303   MCV 80.4 07/19/2018 1303   MCH 28.8 07/19/2018 1303   MCHC 35.8 07/19/2018 1303   RDW 13.2 07/19/2018 1303   LYMPHSABS 2.2 07/19/2018 1303   MONOABS 0.5 07/19/2018 1303   EOSABS 0.2 07/19/2018 1303   BASOSABS 0.1 07/19/2018 1303   CMP Latest Ref Rng & Units 07/19/2018 03/16/2018 01/09/2017  Glucose 70 - 99 mg/dL 126(H) 143(H) 98  BUN 8 - 23 mg/dL 12 14 27(H)  Creatinine 0.44 - 1.00 mg/dL 0.77 0.86 1.11(H)  Sodium 135 - 145 mmol/L 139 142 140  Potassium 3.5 - 5.1 mmol/L 3.5 3.7 3.4(L)  Chloride 98 - 111 mmol/L 106 108 104  CO2 22 - 32 mmol/L 24 26 25   Calcium 8.9 - 10.3 mg/dL 9.3 9.7 10.3  Total Protein 6.5 - 8.1 g/dL 7.6 8.1 7.9  Total Bilirubin 0.3 - 1.2 mg/dL 0.5 0.9 0.5  Alkaline Phos 38 - 126 U/L 134(H) 127(H) 72  AST 15 - 41 U/L 17 19 19   ALT 0 - 44 U/L 19 20 18        DIAGNOSTIC IMAGING:  I have independently reviewed the scans and discussed with the patient.   I have reviewed Venita Lick LPN's note and agree with the documentation.  I personally performed a face-to-face visit, made revisions and my assessment and plan is  as follows.    ASSESSMENT & PLAN:   Cancer of left breast, stage 2 (HCC) 1.  Stage IIb (T2N1) multifocal IDC of the left breast, ER/PR positive, HER-2 negative: -Status post left mastectomy on 12/13/2015, pathology showing 2.6 cm and 1.9 cm, grade 3 IDC, negative margins, no alvei, 1 out of 1 lymph node positive for metastasis, Ki-67 of 15%.  - MammaPrint assay showed high risk of recurrence.  -Adjuvant chemotherapy with 6 cycles of CMF from 03/21/2016 to 06/19/2016. - Anastrozole started around July 2018.  - She is tolerating anastrozole very well without any hot flashes or musculoskeletal symptoms. -Today's examination shows normal left mastectomy site and right breast has no palpable masses.  No palpable adenopathy. -Last mammogram of the right breast was on 09/04/2017.  Diagnostic mammogram on 09/15/2017 was benign. -I have reviewed her blood work.  Her alk phos is elevated for the last 2 times. -I have recommended  imaging of the liver in the form of CT of the abdomen and a bone scan. -We will see her back after the scans to discuss the results.  2.  Osteopenia: - We discussed the results of the DEXA scan dated 07/19/2018 which shows T score of -1.4. -I have told her to take calcium with vitamin D twice daily. -We will plan to repeat bone density test in 2 years. .      Orders placed this encounter:  Orders Placed This Encounter  Procedures   CT Abdomen Pelvis W Contrast   NM Bone Scan Whole Body      Derek Jack, MD [place] [phone]

## 2018-07-26 NOTE — Patient Instructions (Addendum)
[place] at [facility] Discharge Instructions  You were seen today by Dr. Delton Coombes. He went over your recent lab results. Your liver number was a little high, he would like you to have a CT scan and a bone survey. He would like you to start taking the Calcium and Vit D twice a day. He will see you back after scans for  follow up.   Thank you for choosing [place] at [facility] to provide your oncology and hematology care.  To afford each patient quality time with our provider, please arrive at least 15 minutes before your scheduled appointment time.   If you have a lab appointment with the [facility] please come in thru the  Main Entrance and check in at the main information desk  You need to re-schedule your appointment should you arrive 10 or more minutes late.  We strive to give you quality time with our providers, and arriving late affects you and other patients whose appointments are after yours.  Also, if you no show three or more times for appointments you may be dismissed from the clinic at the providers discretion.     Again, thank you for choosing [facility].  Our hope is that these requests will decrease the amount of time that you wait before being seen by our physicians.       _____________________________________________________________  Should you have questions after your visit to [facility], please contact our office at [phone] between the hours of 8:00 a.m. and 4:30 p.m.  Voicemails left after 4:00 p.m. will not be returned until the following business day.  For prescription refill requests, have your pharmacy contact our office and allow 72 hours.    Cancer Center Support Programs:   > Cancer Support Group  2nd Tuesday of the month 1pm-2pm, Journey Room

## 2018-08-03 ENCOUNTER — Encounter (HOSPITAL_COMMUNITY)
Admission: RE | Admit: 2018-08-03 | Discharge: 2018-08-03 | Disposition: A | Discharge status: Home / Self Care | Payer: Medicare Other | Source: Ambulatory Visit | Attending: Hematology | Admitting: Hematology

## 2018-08-03 ENCOUNTER — Encounter (HOSPITAL_COMMUNITY): Payer: Self-pay

## 2018-08-03 ENCOUNTER — Other Ambulatory Visit: Payer: Self-pay

## 2018-08-03 DIAGNOSIS — C50912 Malignant neoplasm of unspecified site of left female breast: Secondary | ICD-10-CM | POA: Diagnosis present

## 2018-08-03 IMAGING — NM NUCLEAR MEDICINE WHOLE BODY BONE SCINTIGRAPHY
2 series · 2 of 2 positions shown · non-contrast
Comparison: None.

CLINICAL DATA: Left breast cancer

EXAM:
NUCLEAR MEDICINE WHOLE BODY BONE SCAN
TECHNIQUE: Whole body anterior and posterior images were obtained approximately
3 hours after intravenous injection of radiopharmaceutical.
RADIOPHARMACEUTICALS:  19.7 mCi [OX] MDP IV

[Series 1: whole body · 2.66mm/px · 1 of 1 slices shown (1 of 2)]
[im 1/1]
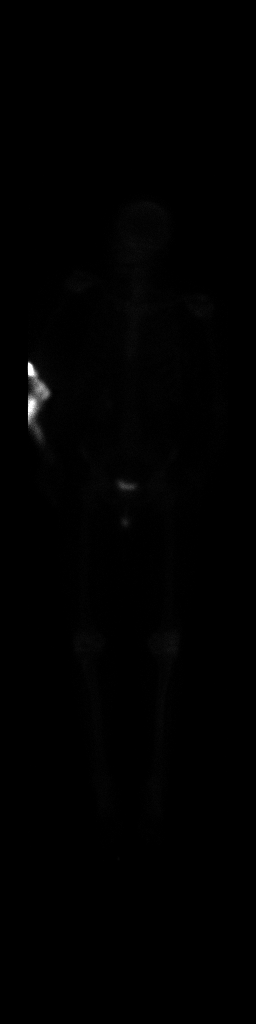

[Series 1: whole body · 2.66mm/px · 1 of 1 slices shown (2 of 2)]
[im 1/1]
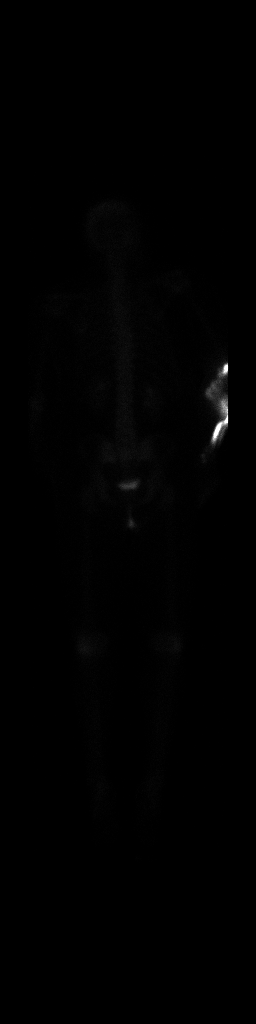

[2 of 2 positions shown; findings below may reference images not displayed]

FINDINGS: No suspicious osseous uptake to suggest metastatic disease.
Radiotracer contamination in the right upper extremity at injection
site. Otherwise soft tissue activity unremarkable.
IMPRESSION: No evidence of osseous metastatic disease.

## 2018-08-03 MED ORDER — TECHNETIUM TC 99M MEDRONATE IV KIT
20.0000 | PACK | Freq: Once | INTRAVENOUS | Status: AC | PRN
  Administered 2018-08-03: 19.7 via INTRAVENOUS

## 2018-08-06 ENCOUNTER — Ambulatory Visit (HOSPITAL_COMMUNITY)
Admission: RE | Admit: 2018-08-06 | Discharge: 2018-08-06 | Disposition: A | Discharge status: Home / Self Care | Payer: Medicare Other | Source: Ambulatory Visit | Attending: Hematology | Admitting: Hematology

## 2018-08-06 ENCOUNTER — Other Ambulatory Visit: Payer: Self-pay

## 2018-08-06 ENCOUNTER — Encounter (HOSPITAL_COMMUNITY): Payer: Self-pay

## 2018-08-06 DIAGNOSIS — C50912 Malignant neoplasm of unspecified site of left female breast: Secondary | ICD-10-CM | POA: Diagnosis not present

## 2018-08-06 MED ORDER — IOHEXOL 300 MG/ML  SOLN
100.0000 mL | Freq: Once | INTRAMUSCULAR | Status: AC | PRN
  Administered 2018-08-06: 100 mL via INTRAVENOUS

## 2018-08-11 ENCOUNTER — Ambulatory Visit (HOSPITAL_COMMUNITY): Payer: Medicare Other | Admitting: Hematology

## 2018-09-09 ENCOUNTER — Ambulatory Visit (HOSPITAL_COMMUNITY): Payer: Medicare Other | Admitting: Nurse Practitioner

## 2018-09-13 ENCOUNTER — Other Ambulatory Visit: Payer: Self-pay

## 2018-09-13 ENCOUNTER — Inpatient Hospital Stay (HOSPITAL_COMMUNITY): Payer: Medicare Other | Attending: Hematology | Admitting: Nurse Practitioner

## 2018-09-13 DIAGNOSIS — Z9011 Acquired absence of right breast and nipple: Secondary | ICD-10-CM | POA: Diagnosis not present

## 2018-09-13 DIAGNOSIS — C50912 Malignant neoplasm of unspecified site of left female breast: Secondary | ICD-10-CM

## 2018-09-13 DIAGNOSIS — Z9221 Personal history of antineoplastic chemotherapy: Secondary | ICD-10-CM

## 2018-09-13 DIAGNOSIS — M8580 Other specified disorders of bone density and structure, unspecified site: Secondary | ICD-10-CM

## 2018-09-13 DIAGNOSIS — R109 Unspecified abdominal pain: Secondary | ICD-10-CM

## 2018-09-13 DIAGNOSIS — Z79811 Long term (current) use of aromatase inhibitors: Secondary | ICD-10-CM | POA: Diagnosis not present

## 2018-09-13 DIAGNOSIS — Z1231 Encounter for screening mammogram for malignant neoplasm of breast: Secondary | ICD-10-CM

## 2018-09-13 NOTE — Progress Notes (Signed)
Virtual Visit via Telephone Note  I connected with Julie Parker on 09/13/18 at 0830 AM EDT by telephone and verified that I am speaking with the correct person using two identifiers.   I discussed the limitations, risks, security and privacy concerns of performing an evaluation and management service by telephone and the availability of in person appointments. I also discussed with the patient that there may be a patient responsible charge related to this service. The patient expressed understanding and agreed to proceed.   History of Present Illness: - Patient was following up today from a CT abdomen pelvis due to her abdominal pain for the last 6 months.  She reports her abdominal pain is better.  No signs of bright red bleeding or melena. Denies any nausea, vomiting, or diarrhea. Denies any new pains. Had not noticed any recent bleeding such as epistaxis, hematuria or hematochezia. Denies recent chest pain on exertion, shortness of breath on minimal exertion, pre-syncopal episodes, or palpitations. Denies any numbness or tingling in hands or feet. Denies any recent fevers, infections, or recent hospitalizations. Patient reports appetite at 100% and energy level at 100%.  She is eating well and maintaining her weight at this time.    Observations/Objective: -Patient appeared alert and oriented and in no distress. - Physical assessment deferred due to telephone visit.  Assessment and Plan: 1.  Stage IIb multifocal IDC of the right breast: - Status post left mastectomy on 12/13/2015, pathology showing 2.6 cm and 1.9 cm, grade 3 IDC, negative margins, no alvei, 1 out of 1 lymph node positive for metastasis, ER/PR positive, HER-2 negative, Ki-67 of 15%. - Recurrent risk showed high risk of recurrence. - Adjuvant chemotherapy with 6 cycles of CMF from 03/21/2016 to 06/19/2016. -Anastrozole started around July 2018. -She is tolerating anastrozole very well without any hot flashes or musculoskeletal  problems. - Her mammogram is due at the end of May for her right breast and which she will follow-up after with labs.  2.  Abdominal pain for 6 months: - Labs showed her alkaline phosphatase was elevated the last 2 times. -She was also having vague left-sided abdominal pain for 6 months. - She was sent for CT of the abdomen and pelvis and a bone scan.  We discussed these results over the phone today. - CT [facility] showed no metastatic disease. - We will continue to monitor her abdominal pain.  3.  Osteopenia: -We discussed the results of her DEXA scan on 07/19/2018 which showed a T score of -1.4. - She is taking calcium and vitamin D twice daily. -We will repeat a bone density scan in 2 years.   Follow Up Instructions: - Follow-up in 1 month after your mammogram with labs.   I discussed the assessment and treatment plan with the patient. The patient was provided an opportunity to ask questions and all were answered. The patient agreed with the plan and demonstrated an understanding of the instructions.   The patient was advised to call back or seek an in-person evaluation if the symptoms worsen or if the condition fails to improve as anticipated.  I provided 20 minutes of non-face-to-face time during this encounter.  Patient did not have access to video chat.  Glennie Isle, NP-C

## 2018-09-30 ENCOUNTER — Encounter (HOSPITAL_COMMUNITY): Payer: Self-pay | Admitting: *Deleted

## 2018-09-30 NOTE — Progress Notes (Signed)
We received a letter from [facility] stating that patient is now enrolled with their services.  Patient will bring her ID card with her when she comes for her next visit.

## 2018-10-14 ENCOUNTER — Ambulatory Visit (HOSPITAL_COMMUNITY): Payer: Medicare Other | Admitting: Nurse Practitioner

## 2018-11-01 ENCOUNTER — Other Ambulatory Visit (HOSPITAL_COMMUNITY): Payer: Medicare Other

## 2018-11-01 ENCOUNTER — Ambulatory Visit (HOSPITAL_COMMUNITY): Payer: Medicare Other

## 2018-11-08 ENCOUNTER — Ambulatory Visit (HOSPITAL_COMMUNITY): Payer: Medicare Other | Admitting: Hematology

## 2019-01-04 DEATH — deceased
